# Patient Record
Sex: Female | Born: 1980
Health system: Southern US, Academic
[De-identification: ages and names within clinical notes are randomized; demographics above are authoritative.]

## PROBLEM LIST (undated history)

## (undated) ENCOUNTER — Ambulatory Visit
Payer: MEDICAID | Attending: Student in an Organized Health Care Education/Training Program | Primary: Student in an Organized Health Care Education/Training Program

## (undated) ENCOUNTER — Encounter

## (undated) ENCOUNTER — Ambulatory Visit: Payer: MEDICAID | Attending: Psychiatric/Mental Health | Primary: Psychiatric/Mental Health

## (undated) ENCOUNTER — Ambulatory Visit: Attending: Clinical | Primary: Clinical

## (undated) ENCOUNTER — Telehealth
Attending: Student in an Organized Health Care Education/Training Program | Primary: Student in an Organized Health Care Education/Training Program

## (undated) ENCOUNTER — Other Ambulatory Visit

## (undated) ENCOUNTER — Telehealth

## (undated) ENCOUNTER — Ambulatory Visit

## (undated) ENCOUNTER — Encounter
Attending: Student in an Organized Health Care Education/Training Program | Primary: Student in an Organized Health Care Education/Training Program

## (undated) ENCOUNTER — Encounter: Attending: Psychiatric/Mental Health | Primary: Psychiatric/Mental Health

## (undated) ENCOUNTER — Ambulatory Visit: Payer: Medicaid (Managed Care)

## (undated) ENCOUNTER — Ambulatory Visit: Payer: MEDICAID | Attending: Clinical | Primary: Clinical

## (undated) ENCOUNTER — Telehealth: Attending: Psychiatric/Mental Health | Primary: Psychiatric/Mental Health

## (undated) ENCOUNTER — Ambulatory Visit: Payer: MEDICAID

## (undated) ENCOUNTER — Telehealth: Attending: Clinical | Primary: Clinical

## (undated) ENCOUNTER — Ambulatory Visit: Payer: MEDICAID | Attending: Internal Medicine | Primary: Internal Medicine

## (undated) ENCOUNTER — Encounter: Attending: Psychosomatic Medicine | Primary: Psychosomatic Medicine

## (undated) ENCOUNTER — Encounter: Attending: Clinical | Primary: Clinical

## (undated) DIAGNOSIS — F329 Major depressive disorder, single episode, unspecified: Secondary | ICD-10-CM

## (undated) HISTORY — PX: PANCREAS SURGERY: SHX731

## (undated) MED ORDER — HYDROXYZINE HCL 10 MG TABLET: Freq: Three times a day (TID) | ORAL | 0 days | PRN

---
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
## 2004-04-11 ENCOUNTER — Emergency Department: Payer: Self-pay | Admitting: Emergency Medicine

## 2004-05-08 ENCOUNTER — Ambulatory Visit: Payer: Self-pay | Admitting: Physician Assistant

## 2005-07-30 ENCOUNTER — Emergency Department: Payer: Self-pay | Admitting: Emergency Medicine

## 2005-08-04 ENCOUNTER — Emergency Department: Payer: Self-pay | Admitting: General Practice

## 2005-09-08 ENCOUNTER — Emergency Department: Payer: Self-pay | Admitting: Emergency Medicine

## 2005-09-14 ENCOUNTER — Emergency Department: Payer: Self-pay | Admitting: Unknown Physician Specialty

## 2005-10-01 ENCOUNTER — Emergency Department: Payer: Self-pay | Admitting: Emergency Medicine

## 2006-03-10 ENCOUNTER — Emergency Department: Payer: Self-pay | Admitting: Emergency Medicine

## 2006-12-07 ENCOUNTER — Inpatient Hospital Stay: Payer: Self-pay | Admitting: Internal Medicine

## 2007-03-30 IMAGING — CT CT ABD-PELV W/O CM
1 of 2 series · 15 of 32 positions shown, 19 images · non-contrast
Comparison: none

REASON FOR EXAM: Abdominal pain
COMMENTS:

PROCEDURE:     CT  - CT ABDOMEN AND PELVIS W[DATE]  [DATE]
RESULT:     Noncontrast emergent CT scan of abdomen and pelvis is performed.
There are no prior studies available for comparison.

[Series 2: stone · axial · 0.69mm/px · z∈[-978,-609]mm · 15 of 138 slices shown, 19 images]
[im 10/138  soft-tissue]
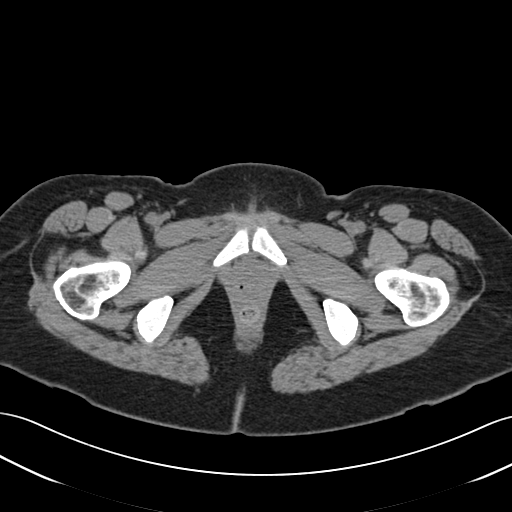
[im 10/138  bone]
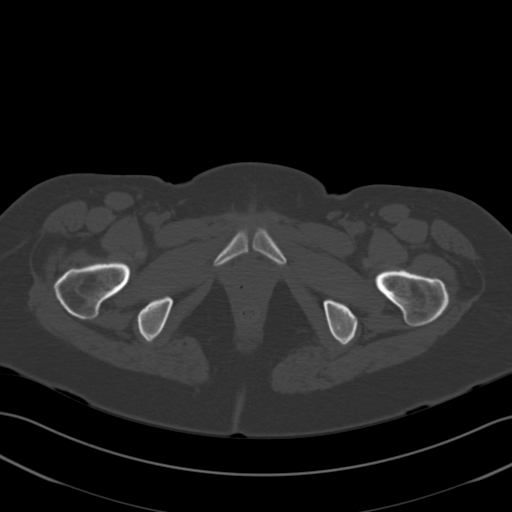
[im 20/138  soft-tissue]
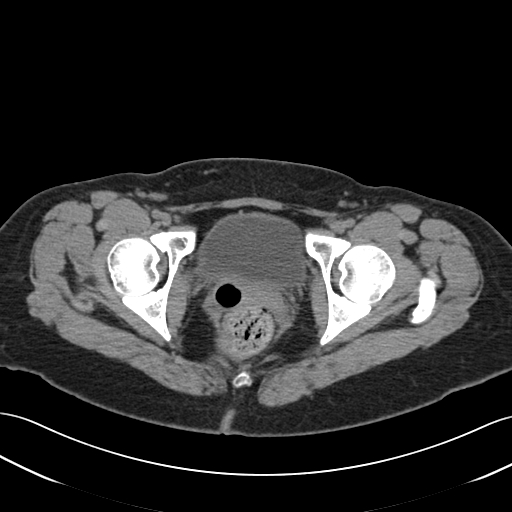
[im 30/138  soft-tissue]
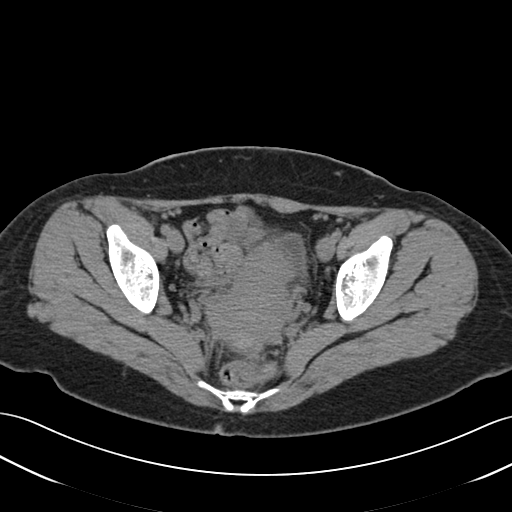
[im 40/138  soft-tissue]
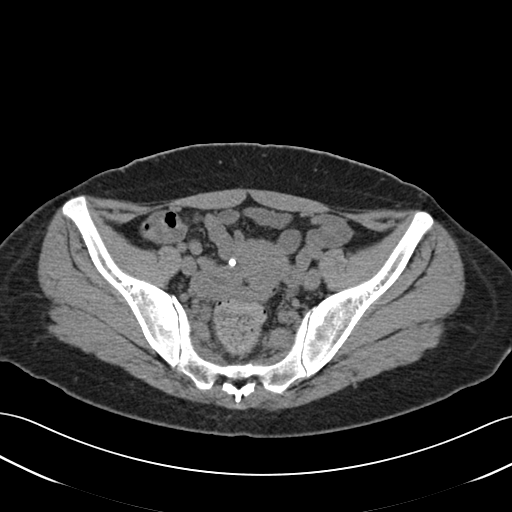
[im 49/138  soft-tissue]
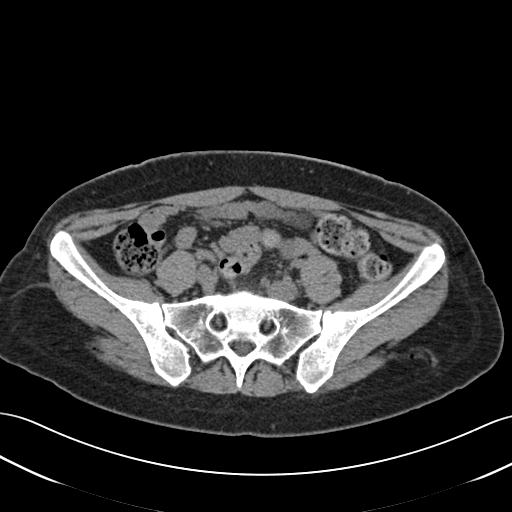
[im 59/138  soft-tissue]
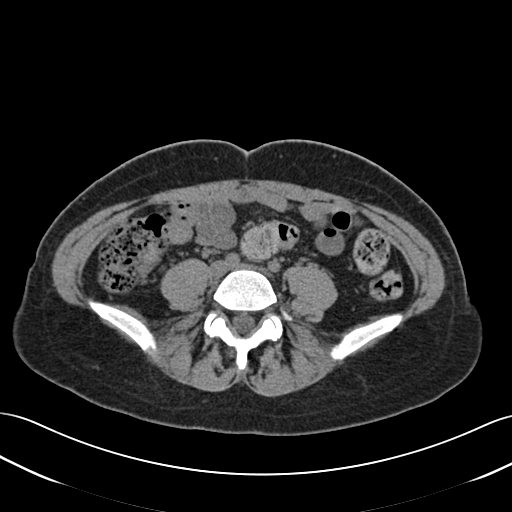
[im 69/138  soft-tissue]
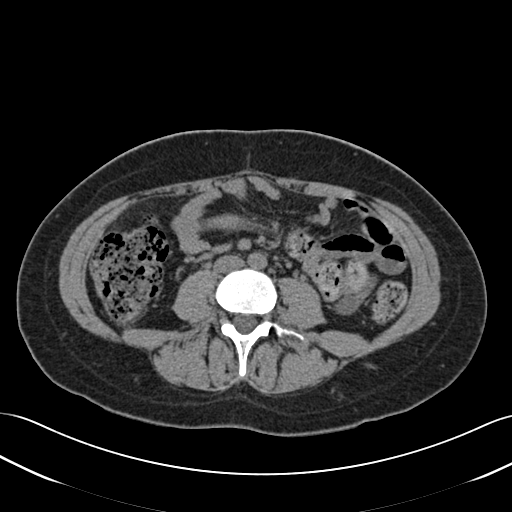
[im 79/138  soft-tissue]
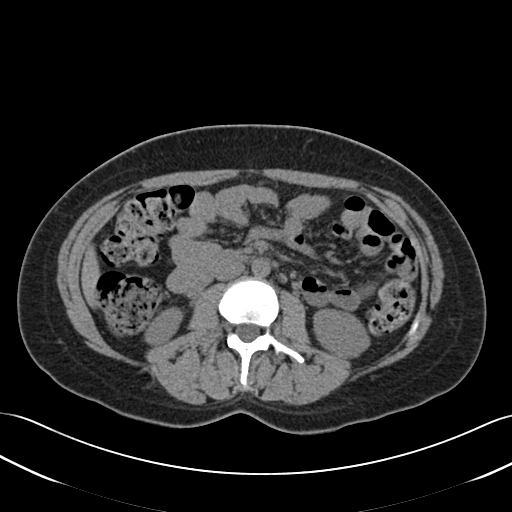
[im 89/138  soft-tissue]
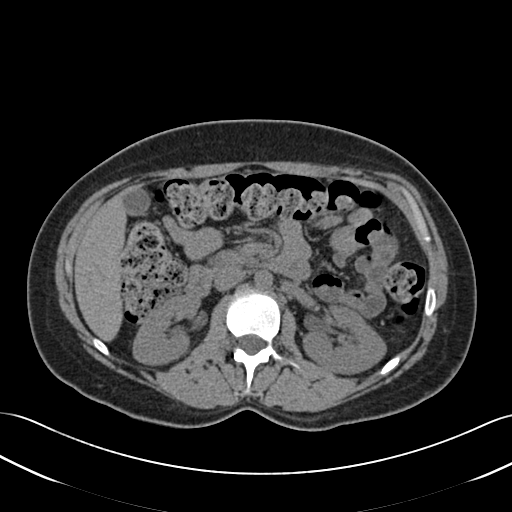
[im 89/138  bone]
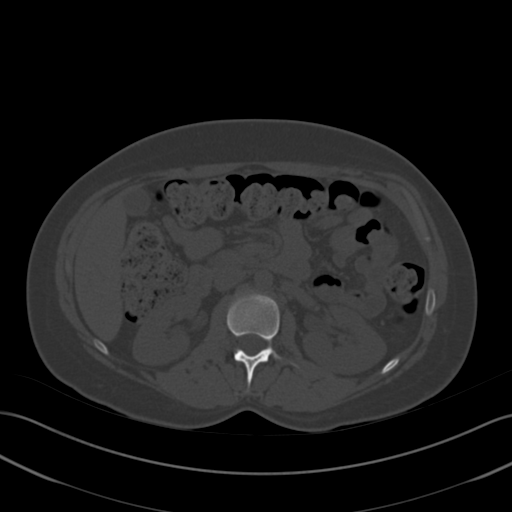
[im 98/138  soft-tissue]
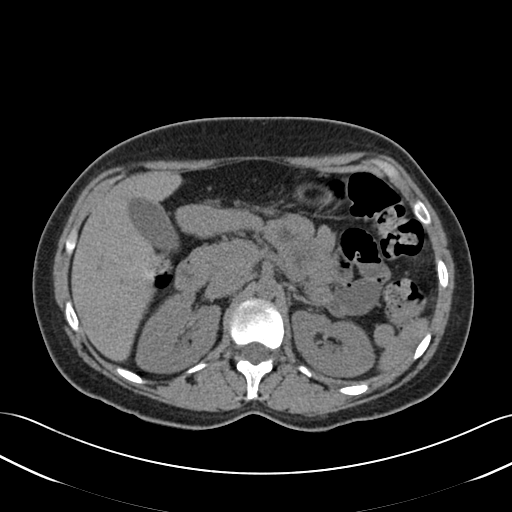
[im 108/138  soft-tissue]
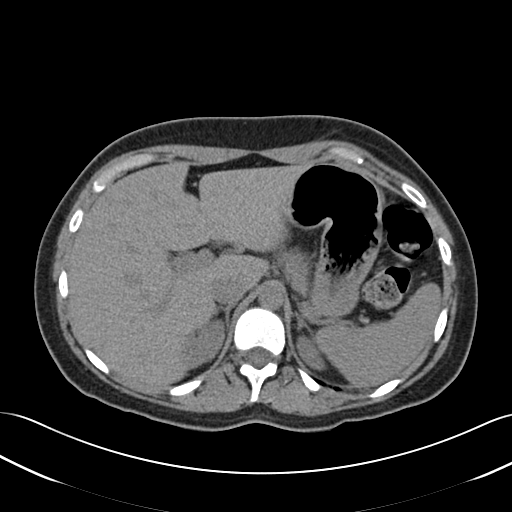
[im 118/138  soft-tissue]
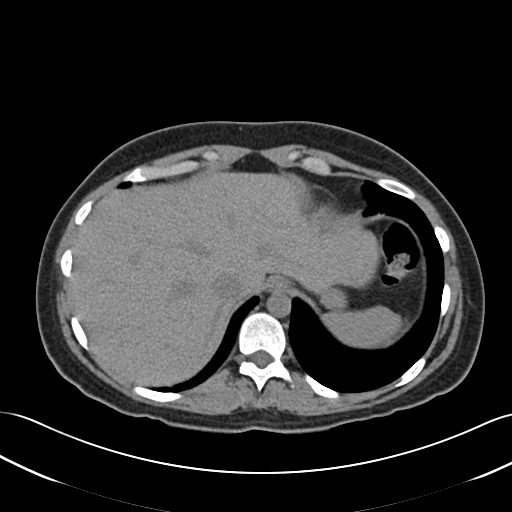
[im 118/138  lung]
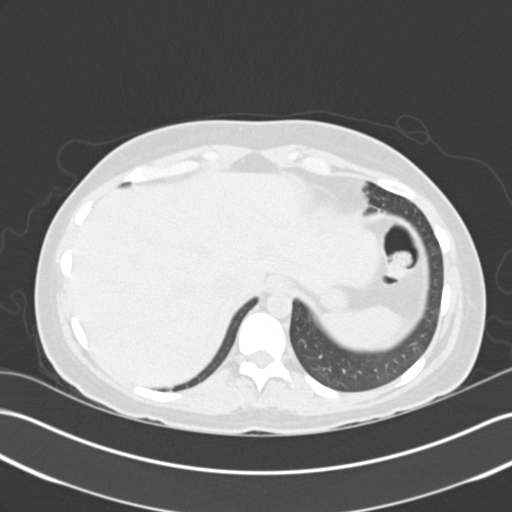
[im 123/138  lung]
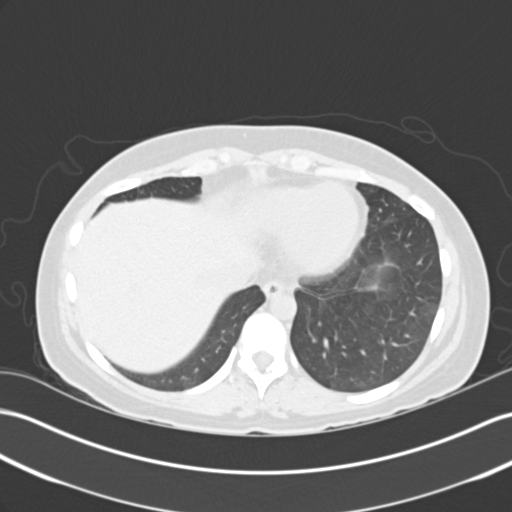
[im 128/138  soft-tissue]
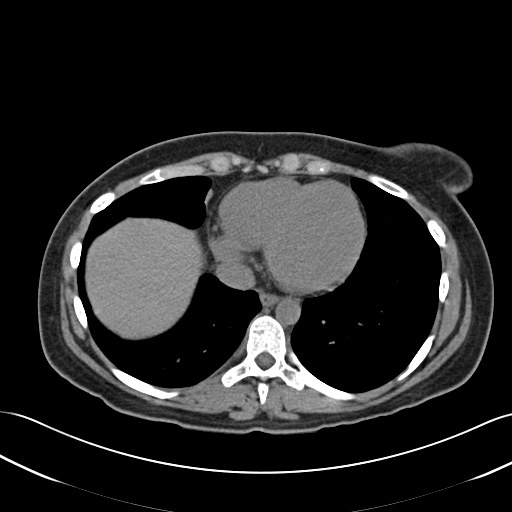
[im 128/138  lung]
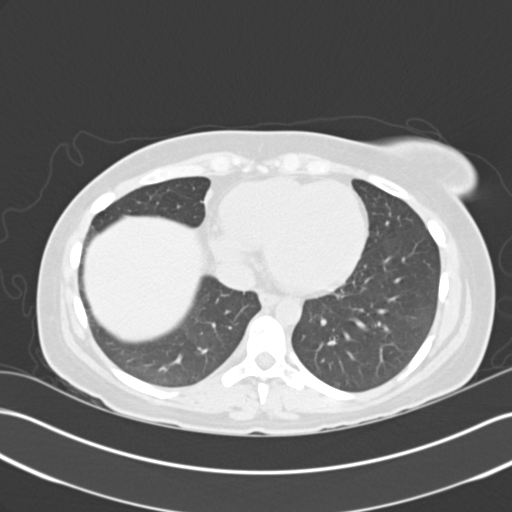
[im 133/138  lung]
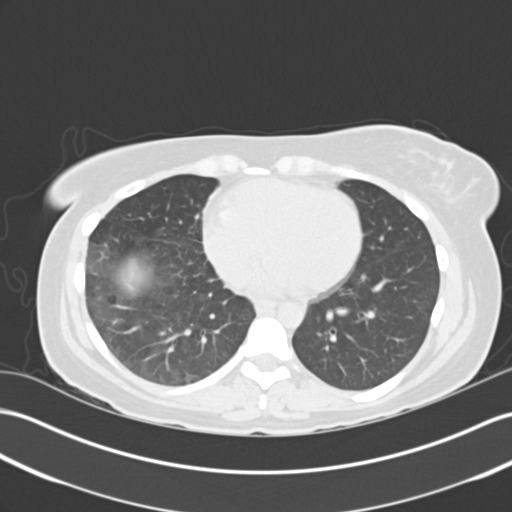

[15 of 32 positions shown; findings below may reference images not displayed]

FINDINGS: The lung bases are clear. The adrenal glands appear to be normal.
No definite radiopaque urinary tract stone or obstructive change is evident.
The appendix appears to be normal. There is no evidence of diverticulitis.
There appears to be a vaginal tampon present. No significant free pelvic
fluid is seen. The uterus is present. The patient appears to be status post
tubal ligation. The abdominal aorta is normal in caliber. No radiopaque
gallstones are evident. Again, the kidneys show no obstructive change or
evidence of stones. There is no perirenal, retroperitoneal or peritoneal
inflammatory stranding.
IMPRESSION: No findings to suggest hydronephrosis or hydroureter. No
stones are evident.

## 2007-07-02 ENCOUNTER — Emergency Department: Payer: Self-pay | Admitting: Emergency Medicine

## 2007-07-05 ENCOUNTER — Emergency Department: Payer: Self-pay | Admitting: Emergency Medicine

## 2007-08-30 ENCOUNTER — Emergency Department: Payer: Self-pay | Admitting: Emergency Medicine

## 2008-05-30 ENCOUNTER — Emergency Department: Payer: Self-pay | Admitting: Emergency Medicine

## 2008-08-01 ENCOUNTER — Emergency Department: Payer: Self-pay | Admitting: Unknown Physician Specialty

## 2009-04-23 ENCOUNTER — Emergency Department: Payer: Self-pay | Admitting: Emergency Medicine

## 2009-05-15 ENCOUNTER — Emergency Department: Payer: Self-pay | Admitting: Emergency Medicine

## 2009-07-29 ENCOUNTER — Emergency Department: Payer: Self-pay | Admitting: Internal Medicine

## 2010-06-26 ENCOUNTER — Emergency Department: Payer: Self-pay | Admitting: Emergency Medicine

## 2011-01-18 ENCOUNTER — Emergency Department: Payer: Self-pay | Admitting: Internal Medicine

## 2011-01-20 ENCOUNTER — Ambulatory Visit: Payer: Self-pay | Admitting: Orthopedic Surgery

## 2014-10-27 ENCOUNTER — Emergency Department
Admission: EM | Admit: 2014-10-27 | Discharge: 2014-10-27 | Disposition: A | Discharge status: Home / Self Care | Payer: Self-pay

## 2016-10-07 MED FILL — PAROXETINE HCL/10MG/TAB: PAROXETINE HCL/10MG/TAB | 30 days supply | Qty: 30 | Fill #0

## 2016-11-10 ENCOUNTER — Emergency Department
Admission: EM | Admit: 2016-11-10 | Discharge: 2016-11-11 | Disposition: A | Discharge status: Home / Self Care | Payer: Medicaid Other | Attending: Emergency Medicine | Admitting: Emergency Medicine

## 2016-11-10 ENCOUNTER — Encounter: Payer: Self-pay | Admitting: Emergency Medicine

## 2016-11-10 DIAGNOSIS — R443 Hallucinations, unspecified: Secondary | ICD-10-CM

## 2016-11-10 DIAGNOSIS — R440 Auditory hallucinations: Secondary | ICD-10-CM | POA: Diagnosis not present

## 2016-11-10 DIAGNOSIS — F309 Manic episode, unspecified: Secondary | ICD-10-CM | POA: Diagnosis present

## 2016-11-10 DIAGNOSIS — F1910 Other psychoactive substance abuse, uncomplicated: Secondary | ICD-10-CM

## 2016-11-10 DIAGNOSIS — F1510 Other stimulant abuse, uncomplicated: Secondary | ICD-10-CM | POA: Insufficient documentation

## 2016-11-10 DIAGNOSIS — R441 Visual hallucinations: Secondary | ICD-10-CM | POA: Diagnosis not present

## 2016-11-10 LAB — CBC
HCT: 40.1 % (ref 35.0–47.0)
HEMOGLOBIN: 13.5 g/dL (ref 12.0–16.0)
MCH: 32.5 pg (ref 26.0–34.0)
MCHC: 33.7 g/dL (ref 32.0–36.0)
MCV: 96.4 fL (ref 80.0–100.0)
PLATELETS: 347 10*3/uL (ref 150–440)
RBC: 4.16 MIL/uL (ref 3.80–5.20)
RDW: 14.5 % (ref 11.5–14.5)
WBC: 12 10*3/uL — AB (ref 3.6–11.0)

## 2016-11-10 LAB — URINALYSIS, COMPLETE (UACMP) WITH MICROSCOPIC
Bilirubin Urine: NEGATIVE
Glucose, UA: NEGATIVE mg/dL
Hgb urine dipstick: NEGATIVE
KETONES UR: 5 mg/dL — AB
LEUKOCYTES UA: NEGATIVE
NITRITE: NEGATIVE
PROTEIN: NEGATIVE mg/dL
SPECIFIC GRAVITY, URINE: 1.023 (ref 1.005–1.030)
pH: 5 (ref 5.0–8.0)

## 2016-11-10 LAB — POCT PREGNANCY, URINE: PREG TEST UR: NEGATIVE

## 2016-11-10 LAB — COMPREHENSIVE METABOLIC PANEL
ALT: 16 U/L (ref 14–54)
ANION GAP: 9 (ref 5–15)
AST: 23 U/L (ref 15–41)
Albumin: 4.2 g/dL (ref 3.5–5.0)
Alkaline Phosphatase: 48 U/L (ref 38–126)
BUN: 9 mg/dL (ref 6–20)
CHLORIDE: 109 mmol/L (ref 101–111)
CO2: 24 mmol/L (ref 22–32)
Calcium: 9.5 mg/dL (ref 8.9–10.3)
Creatinine, Ser: 0.63 mg/dL (ref 0.44–1.00)
GFR calc Af Amer: >60 mL/min (ref >60)
GFR calc non Af Amer: >60 mL/min (ref >60)
GLUCOSE: 112 mg/dL — AB (ref 65–99)
POTASSIUM: 4 mmol/L (ref 3.5–5.1)
Sodium: 142 mmol/L (ref 135–145)
TOTAL PROTEIN: 7.2 g/dL (ref 6.5–8.1)
Total Bilirubin: 0.6 mg/dL (ref 0.3–1.2)

## 2016-11-10 LAB — URINE DRUG SCREEN, QUALITATIVE (ARMC ONLY)
AMPHETAMINES, UR SCREEN: POSITIVE — AB
BENZODIAZEPINE, UR SCRN: NOT DETECTED
Barbiturates, Ur Screen: NOT DETECTED
Cannabinoid 50 Ng, Ur ~~LOC~~: NOT DETECTED
Cocaine Metabolite,Ur ~~LOC~~: NOT DETECTED
MDMA (ECSTASY) UR SCREEN: NOT DETECTED
METHADONE SCREEN, URINE: NOT DETECTED
OPIATE, UR SCREEN: NOT DETECTED
PHENCYCLIDINE (PCP) UR S: NOT DETECTED
Tricyclic, Ur Screen: NOT DETECTED

## 2016-11-10 LAB — ETHANOL: Alcohol, Ethyl (B): <5 mg/dL (ref <5)

## 2016-11-10 MED ORDER — FOSFOMYCIN TROMETHAMINE 3 G PO PACK
3.0000 g | PACK | Freq: Once | ORAL | Status: AC
  Administered 2016-11-10: 3 g via ORAL
  Filled 2016-11-10 (×2): qty 3

## 2016-11-10 NOTE — ED Notes (Signed)
Pt. To BHU from ED ambulatory without difficulty, to room  BHU 2. Report from [facility]. Pt. Is alert and oriented, warm and dry in no distress. Pt. Denies SI, HI, and AVH. Pt. Calm and cooperative. Pt made aware of positive UDS for amphetamine, patient states she had took some adderall the other day and she is not prescribed the medication. Pt states she was just having a bad day and took them. Juice and sandwich tray given to patient. Pt. Made aware of security cameras and Q15 minute rounds. Pt. Encouraged to let Nursing staff know of any concerns or needs.

## 2016-11-10 NOTE — ED Notes (Signed)
Belongings catalogs and placed in locker [place].  Bag consist of 1-yellow tank top, 1- pair of light washed jeans, 1- pair of pink socks, 1- black panties, 1-nude color bra, 1- pain of black lace up tennis shoes, 1- clear hair clip.

## 2016-11-10 NOTE — ED Provider Notes (Signed)
[facility] Emergency Department Provider Note  ____________________________________________  Time seen: Approximately 9:39 PM  I have reviewed the triage vital signs and the nursing notes.   HISTORY  Chief Complaint Manic Behavior   HPI Tamara Nguyen is a 36 y.o. female with a history of pancreatitis who presents IVC by her mother for bizarre behavior. According to IVC paperwork patient has been talking to people that are not in the house. She has been having visual and auditory hallucinations. Patient has a 75 year old daughter who lives with her and is afraid the patient will mistake daughter by somebody else and hurt her. Patient denies hallucinations. She denies any prior psychiatric history. She denies drugs or alcohol. She does report being under a lot of stress since separating from her husband a week ago. She denies any suicidal or homicidal ideation. She denies any prior psychiatric admissions.She denies any medical complaints.  No past medical history on file.  There are no active problems to display for this patient.   No past surgical history on file.  Prior to Admission medications   Medication Sig Start Date End Date Taking? Authorizing Provider  lipase/protease/amylase (CREON) 12000 units CPEP capsule Take 12,000 Units by mouth as directed.   Yes [provider]  prochlorperazine (COMPAZINE) 10 MG tablet Take 10 mg by mouth daily as needed for nausea or vomiting.   Yes [provider]    Allergies Patient has no known allergies.  No family history on file.  Social History Social History  Substance Use Topics  . Smoking status: Not on file  . Smokeless tobacco: Not on file  . Alcohol use Not on file    Review of Systems  Constitutional: Negative for fever. Eyes: Negative for visual changes. ENT: Negative for sore throat. Neck: No neck pain  Cardiovascular: Negative for chest pain. Respiratory: Negative  for shortness of breath. Gastrointestinal: Negative for abdominal pain, vomiting or diarrhea. Genitourinary: Negative for dysuria. Musculoskeletal: Negative for back pain. Skin: Negative for rash. Neurological: Negative for headaches, weakness or numbness. Psych: No SI or HI  ____________________________________________   PHYSICAL EXAM:  VITAL SIGNS: ED Triage Vitals [11/10/16 2040]  Enc Vitals Group     BP 118/65     Pulse Rate 65     Resp 20     Temp 98.1 F (36.7 C)     Temp Source Oral     SpO2 100 %     Weight 120 lb (54.4 kg)     Height 5\' 1"  (1.549 m)     Head Circumference      Peak Flow      Pain Score      Pain Loc      Pain Edu?      Excl. in GC?     Constitutional: Alert and oriented. Well appearing and in no apparent distress. HEENT:      Head: Normocephalic and atraumatic.         Eyes: Conjunctivae are normal. Sclera is non-icteric.       Mouth/Throat: Mucous membranes are moist.       Neck: Supple with no signs of meningismus. Cardiovascular: Regular rate and rhythm. No murmurs, gallops, or rubs. 2+ symmetrical distal pulses are present in all extremities. No JVD. Respiratory: Normal respiratory effort. Lungs are clear to auscultation bilaterally. No wheezes, crackles, or rhonchi.  Gastrointestinal: Soft, non tender, and non distended with positive bowel sounds. No rebound or guarding. Genitourinary: No CVA tenderness. Musculoskeletal:  Nontender with normal range of motion in all extremities. No edema, cyanosis, or erythema of extremities. Neurologic: Normal speech and language. Face is symmetric. Moving all extremities. No gross focal neurologic deficits are appreciated. Skin: Skin is warm, dry and intact. No rash noted. Psychiatric: Mood and affect are normal. Speech and behavior are normal.  ____________________________________________   LABS (all labs ordered are listed, but only abnormal results are displayed)  Labs Reviewed  CBC - Abnormal;  Notable for the following:       Result Value   WBC 12.0 (*)    All other components within normal limits  COMPREHENSIVE METABOLIC PANEL - Abnormal; Notable for the following:    Glucose, Bld 112 (*)    All other components within normal limits  URINALYSIS, COMPLETE (UACMP) WITH MICROSCOPIC - Abnormal; Notable for the following:    Color, Urine AMBER (*)    APPearance CLEAR (*)    Ketones, ur 5 (*)    Bacteria, UA RARE (*)    Squamous Epithelial / LPF 0-5 (*)    All other components within normal limits  URINE DRUG SCREEN, QUALITATIVE ([facility] ONLY) - Abnormal; Notable for the following:    Amphetamines, Ur Screen POSITIVE (*)    All other components within normal limits  ETHANOL  POC URINE PREG, ED  POCT PREGNANCY, URINE   ____________________________________________  EKG  none  ____________________________________________  RADIOLOGY  none  ____________________________________________   PROCEDURES  Procedure(s) performed: None Procedures Critical Care performed:  None ____________________________________________   INITIAL IMPRESSION / ASSESSMENT AND PLAN / ED COURSE  36 y.o. female with a history of pancreatitis who presents IVC by her mother for bizarre behavior. Patient denies hallucinations, SI or HI. Patient is cooperative, neurologically intact, normal vital signs.blood work for no acute findings. Drug screen positive for amphetamines. Will keep IVC and consult psych for further evaluation.     Pertinent labs & imaging results that were available during my care of the patient were reviewed by me and considered in my medical decision making (see chart for details).    ____________________________________________   FINAL CLINICAL IMPRESSION(S) / ED DIAGNOSES  Final diagnoses:  Hallucinations      NEW MEDICATIONS STARTED DURING THIS VISIT:  New Prescriptions   No medications on file     Note:  This document was prepared using Dragon voice  recognition software and may include unintentional dictation errors.    Nita Sickle, MD 11/10/16 [phone]

## 2016-11-10 NOTE — BH Assessment (Signed)
Assessment Note  Tamara Nguyen is an 36 y.o. female. The patient came in under IVC after her mother reported she was having hallucinations.  The patient is denying having hallucinations and reported she has never had hallucinations.  She describes her primary stressors as her recent separation from her husband.  She has been separated for a week.  She is also not getting along with her mother.  She reported some days the patient will sleep more than she needs to and for the past few days the patient has not been sleeping many hours at night.  She also has a decreased appetite and concentration.   She is currently not seeing a counselor or psychiatrist.  However, she is taking medication for depression, which is prescribed by her PCP.  She is now taking Paxil.  She has a history of alcohol abuse and has been sober for 2 years.  She denies present SI, HI, SA and psychosis.  Diagnosis: Major Depressive Disorder, Severe  Past Medical History: No past medical history on file.  No past surgical history on file.  Family History: No family history on file.  Social History:  has no tobacco, alcohol, and drug history on file.  Additional Social History:  Alcohol / Drug Use Pain Medications: See PTA Prescriptions: See PTA Over the Counter: See PTA History of alcohol / drug use?: Yes Longest period of sobriety (when/how long): 2 years Substance #1 Name of Substance 1: alcohol 1 - Amount (size/oz): 3 24 oz beers 1 - Duration: daily 1 - Last Use / Amount: October 2016  CIWA: CIWA-Ar BP: 118/65 Pulse Rate: 65 COWS:    Allergies: No Known Allergies  Home Medications:  (Not in a hospital admission)  OB/GYN Status:  Patient's last menstrual period was 10/10/2016.  General Assessment Data Location of Assessment: [facility] ED TTS Assessment: In system Is this a Tele or Face-to-Face Assessment?: Face-to-Face Is this an Initial Assessment or a Re-assessment for this encounter?: Initial  Assessment Marital status: Separated Is patient pregnant?: No Pregnancy Status: No Living Arrangements: Children Can pt return to current living arrangement?: Yes Admission Status: Involuntary Is patient capable of signing voluntary admission?: No Referral Source: Self/Family/Friend Insurance type: Medicaid     Crisis Care Plan Living Arrangements: Children Legal Guardian: Other: (Self) Name of Psychiatrist: none Name of Therapist: none  Education Status Is patient currently in school?: No Current Grade: NA Highest grade of school patient has completed: 12 th grade Name of school: NA Contact person: NA  Risk to self with the past 6 months Suicidal Ideation: No Has patient been a risk to self within the past 6 months prior to admission? : No Suicidal Intent: No Has patient had any suicidal intent within the past 6 months prior to admission? : No Is patient at risk for suicide?: No Suicidal Plan?: No Has patient had any suicidal plan within the past 6 months prior to admission? : No Access to Means: No What has been your use of drugs/alcohol within the last 12 months?: History of alcohol abuse has been sober for 2 years. Previous Attempts/Gestures: No How many times?: 0 Other Self Harm Risks: none Triggers for Past Attempts: None known Intentional Self Injurious Behavior: None Family Suicide History: Unknown Recent stressful life event(s): Other (Comment), Conflict (Comment) (conflict with mother and recent seperation) Persecutory voices/beliefs?: No Depression: Yes Depression Symptoms: Insomnia, Isolating, Loss of interest in usual pleasures Substance abuse history and/or treatment for substance abuse?: Yes Suicide prevention information given to  non-admitted patients: Not applicable  Risk to Others within the past 6 months Homicidal Ideation: No Does patient have any lifetime risk of violence toward others beyond the six months prior to admission? : No Thoughts of  Harm to Others: No Current Homicidal Intent: No Current Homicidal Plan: No Access to Homicidal Means: No Identified Victim: none History of harm to others?: No Assessment of Violence: None Noted Violent Behavior Description: none Does patient have access to weapons?: No Criminal Charges Pending?: No Does patient have a court date: No Is patient on probation?: No  Psychosis Hallucinations: None noted Delusions: None noted  Mental Status Report Appearance/Hygiene: Unremarkable, In scrubs Eye Contact: Good Motor Activity: Unremarkable, Freedom of movement Speech: Argumentative, Unremarkable Level of Consciousness: Alert Mood: Pleasant Affect: Appropriate to circumstance Anxiety Level: None Thought Processes: Coherent, Relevant Judgement: Unimpaired Orientation: Person, Place, Time, Situation, Appropriate for developmental age Obsessive Compulsive Thoughts/Behaviors: None  Cognitive Functioning Concentration: Decreased Memory: Recent Intact, Remote Intact IQ: Average Insight: Fair Impulse Control: Good Appetite: Fair Weight Loss: 0 Weight Gain: 0 Sleep: Decreased Total Hours of Sleep: 5 Vegetative Symptoms: None  ADLScreening [facility] Assessment Services) Patient's cognitive ability adequate to safely complete daily activities?: Yes Patient able to express need for assistance with ADLs?: No Independently performs ADLs?: Yes (appropriate for developmental age)  Prior Inpatient Therapy Prior Inpatient Therapy: No Prior Therapy Dates: NA Prior Therapy Facilty/Provider(s): NA Reason for Treatment: NA  Prior Outpatient Therapy Prior Outpatient Therapy: No Prior Therapy Dates: NA Prior Therapy Facilty/Provider(s): NA Reason for Treatment: NA Does patient have an ACCT team?: No Does patient have Intensive In-House Services?  : No Does patient have [facility] services? : No Does patient have P4CC services?: No  ADL Screening (condition at time of admission) Patient's  cognitive ability adequate to safely complete daily activities?: Yes Is the patient deaf or have difficulty hearing?: No Does the patient have difficulty seeing, even when wearing glasses/contacts?: No Does the patient have difficulty concentrating, remembering, or making decisions?: No Patient able to express need for assistance with ADLs?: No Does the patient have difficulty dressing or bathing?: No Independently performs ADLs?: Yes (appropriate for developmental age) Does the patient have difficulty walking or climbing stairs?: No Weakness of Legs: None Weakness of Arms/Hands: None  Home Assistive Devices/Equipment Home Assistive Devices/Equipment: None  Therapy Consults (therapy consults require a physician order) PT Evaluation Needed: No OT Evalulation Needed: No SLP Evaluation Needed: No Abuse/Neglect Assessment (Assessment to be complete while patient is alone) Physical Abuse: Denies Verbal Abuse: Denies Sexual Abuse: Denies Exploitation of patient/patient's resources: Denies Self-Neglect: Denies Values / Beliefs Cultural Requests During Hospitalization: None Spiritual Requests During Hospitalization: None Consults Spiritual Care Consult Needed: No Social Work Consult Needed: No      Additional Information 1:1 In Past 12 Months?: No CIRT Risk: No Elopement Risk: No Does patient have medical clearance?: Yes     Disposition:  Disposition Initial Assessment Completed for this Encounter: Yes Disposition of Patient: Referred to [facility])  On Site Evaluation by:   Reviewed with Physician:    Ottis Stain 11/10/2016 10:45 PM

## 2016-11-10 NOTE — ED Triage Notes (Signed)
Pt brought in IVC for hallucinations, pt denies any HI or SI is calm and cooperative at this time.

## 2016-11-10 NOTE — ED Notes (Signed)
Patient laying in bed currently aware. Pt is calm.  Respirations even and un-labored.

## 2016-11-10 NOTE — ED Notes (Signed)

## 2016-11-10 NOTE — ED Notes (Signed)
Report received from [facility]. Patient to be moved to [facility].

## 2016-11-11 NOTE — ED Notes (Signed)
Patient up to restroom.

## 2016-11-11 NOTE — ED Notes (Signed)
Pt received breakfast tray 

## 2016-11-11 NOTE — ED Notes (Signed)
Patient laying in bed with eyes closed. Respirations even and unlabored.

## 2016-11-11 NOTE — ED Notes (Signed)
Pt discharged to lobby. Pt denies SI/HI and AVH. Pt confirmed all belongings had been returned. Discharge paperwork reviewed with patient.

## 2016-11-11 NOTE — ED Notes (Signed)
Patient made aware of pending discharge. Patient states unable to find ride at this time.

## 2016-11-11 NOTE — ED Provider Notes (Signed)
-----------------------------------------   4:15 AM on 11/11/2016 -----------------------------------------  Patient was evaluated by [facility] psychiatrist Dr. Burnetta Sabin our who deems patient is psychiatrically stable for discharge home. He will resend patient's IVC. No new medication recommendations. Patient will follow-up with local mental health clinic.   Irean Hong, MD 11/11/16 [phone]

## 2016-11-11 NOTE — ED Notes (Signed)
Pt calm and cooperative. Pt denies HI/SI and AVH. Denies pain. Pt allowed to use the phone to call her father and ask about picking her up from the hospital.  Maintained on 15 minute checks and observation by security camera for safety.

## 2016-11-11 NOTE — ED Notes (Signed)
Report given to SOC. SOC in progress.  

## 2016-11-11 NOTE — ED Notes (Addendum)
Patient laying in bed with eyes closed. Respirations even and unlabored.

## 2016-11-11 NOTE — Discharge Instructions (Signed)
Please follow up with the resources provided. Return to the ER for feelings of hurting yourself or others or other concerns

## 2016-11-11 NOTE — ED Notes (Signed)
Patient laying in bed quietly. Patient does not appear asleep. Respiration even and un-labored.

## 2016-11-28 ENCOUNTER — Emergency Department
Admission: EM | Admit: 2016-11-28 | Discharge: 2016-12-01 | Disposition: A | Discharge status: Home / Self Care | Payer: Medicaid Other | Attending: Emergency Medicine | Admitting: Emergency Medicine

## 2016-11-28 DIAGNOSIS — F15950 Other stimulant use, unspecified with stimulant-induced psychotic disorder with delusions: Secondary | ICD-10-CM | POA: Diagnosis not present

## 2016-11-28 DIAGNOSIS — F29 Unspecified psychosis not due to a substance or known physiological condition: Secondary | ICD-10-CM | POA: Insufficient documentation

## 2016-11-28 DIAGNOSIS — F1510 Other stimulant abuse, uncomplicated: Secondary | ICD-10-CM

## 2016-11-28 DIAGNOSIS — Z046 Encounter for general psychiatric examination, requested by authority: Secondary | ICD-10-CM | POA: Diagnosis present

## 2016-11-28 HISTORY — DX: Major depressive disorder, single episode, unspecified: F32.9

## 2016-11-28 LAB — COMPREHENSIVE METABOLIC PANEL
ALT: 18 U/L (ref 14–54)
AST: 19 U/L (ref 15–41)
Albumin: 4.2 g/dL (ref 3.5–5.0)
Alkaline Phosphatase: 48 U/L (ref 38–126)
Anion gap: 9 (ref 5–15)
BILIRUBIN TOTAL: 0.6 mg/dL (ref 0.3–1.2)
BUN: 11 mg/dL (ref 6–20)
CHLORIDE: 104 mmol/L (ref 101–111)
CO2: 27 mmol/L (ref 22–32)
CREATININE: 0.72 mg/dL (ref 0.44–1.00)
Calcium: 9.4 mg/dL (ref 8.9–10.3)
GFR calc Af Amer: >60 mL/min (ref >60)
Glucose, Bld: 115 mg/dL — ABNORMAL HIGH (ref 65–99)
Potassium: 4.4 mmol/L (ref 3.5–5.1)
SODIUM: 140 mmol/L (ref 135–145)
Total Protein: 7 g/dL (ref 6.5–8.1)

## 2016-11-28 LAB — CBC
HEMATOCRIT: 39.4 % (ref 35.0–47.0)
HEMOGLOBIN: 13.5 g/dL (ref 12.0–16.0)
MCH: 33.8 pg (ref 26.0–34.0)
MCHC: 34.3 g/dL (ref 32.0–36.0)
MCV: 98.5 fL (ref 80.0–100.0)
Platelets: 355 10*3/uL (ref 150–440)
RBC: 4 MIL/uL (ref 3.80–5.20)
RDW: 14 % (ref 11.5–14.5)
WBC: 8.7 10*3/uL (ref 3.6–11.0)

## 2016-11-28 NOTE — ED Triage Notes (Signed)
Pt brought in under IVC after verbal argument with daughter. Pt uncooperative regarding answering questions. Pt physically cooperative.

## 2016-11-29 ENCOUNTER — Encounter: Payer: Self-pay | Admitting: Emergency Medicine

## 2016-11-29 LAB — URINE DRUG SCREEN, QUALITATIVE (ARMC ONLY)
Amphetamines, Ur Screen: POSITIVE — AB
BARBITURATES, UR SCREEN: NOT DETECTED
BENZODIAZEPINE, UR SCRN: POSITIVE — AB
COCAINE METABOLITE, UR ~~LOC~~: NOT DETECTED
Cannabinoid 50 Ng, Ur ~~LOC~~: NOT DETECTED
MDMA (Ecstasy)Ur Screen: NOT DETECTED
METHADONE SCREEN, URINE: NOT DETECTED
OPIATE, UR SCREEN: NOT DETECTED
PHENCYCLIDINE (PCP) UR S: NOT DETECTED
Tricyclic, Ur Screen: POSITIVE — AB

## 2016-11-29 LAB — ETHANOL

## 2016-11-29 LAB — ACETAMINOPHEN LEVEL: Acetaminophen (Tylenol), Serum: <10 ug/mL — ABNORMAL LOW (ref 10–30)

## 2016-11-29 LAB — PREGNANCY, URINE: PREG TEST UR: NEGATIVE

## 2016-11-29 LAB — SALICYLATE LEVEL: Salicylate Lvl: <7 mg/dL (ref 2.8–30.0)

## 2016-11-29 MED ORDER — LORAZEPAM 2 MG/ML IJ SOLN
2.0000 mg | Freq: Once | INTRAMUSCULAR | Status: AC
  Administered 2016-11-29: 2 mg via INTRAVENOUS

## 2016-11-29 MED ORDER — ZIPRASIDONE MESYLATE 20 MG IM SOLR
10.0000 mg | Freq: Once | INTRAMUSCULAR | Status: AC
  Administered 2016-11-29: 10 mg via INTRAMUSCULAR

## 2016-11-29 MED ORDER — ZIPRASIDONE MESYLATE 20 MG IM SOLR
INTRAMUSCULAR | Status: AC
  Administered 2016-11-29: 10 mg via INTRAMUSCULAR
  Filled 2016-11-29: qty 20

## 2016-11-29 MED ORDER — LORAZEPAM 2 MG/ML IJ SOLN
INTRAMUSCULAR | Status: AC
  Administered 2016-11-29: 2 mg via INTRAVENOUS
  Filled 2016-11-29: qty 1

## 2016-11-29 MED ORDER — DIPHENHYDRAMINE HCL 50 MG/ML IJ SOLN
INTRAMUSCULAR | Status: AC
  Administered 2016-11-29: 50 mg via INTRAVENOUS
  Filled 2016-11-29: qty 1

## 2016-11-29 MED ORDER — DIPHENHYDRAMINE HCL 50 MG/ML IJ SOLN
50.0000 mg | Freq: Once | INTRAMUSCULAR | Status: AC
  Administered 2016-11-29: 50 mg via INTRAVENOUS

## 2016-11-29 NOTE — ED Notes (Signed)
ED  Is the patient under IVC or is there intent for IVC: Yes.   Is the patient medically cleared: Yes.   Is there vacancy in the ED BHU: Yes.   Is the population mix appropriate for patient: Yes.   Is the patient awaiting placement in inpatient or outpatient setting: Yes.   Has the patient had a psychiatric consult:   Soc consult pending Survey of unit performed for contraband, proper placement and condition of furniture, tampering with fixtures in bathroom, shower, and each patient room: Yes.  ; Findings:  APPEARANCE/BEHAVIOR Calm and cooperative NEURO ASSESSMENT Orientation: oriented x3  Denies pain Hallucinations: No.None noted (Hallucinations) Speech: Normal Gait: normal RESPIRATORY ASSESSMENT Even  Unlabored respirations  CARDIOVASCULAR ASSESSMENT Pulses equal   regular rate  Skin warm and dry   GASTROINTESTINAL ASSESSMENT no GI complaint EXTREMITIES Full ROM  PLAN OF CARE Provide calm/safe environment. Vital signs assessed twice daily. ED BHU Assessment once each 12-hour shift. Collaborate with TTS daily or as condition indicates. Assure the ED provider has rounded once each shift. Provide and encourage hygiene. Provide redirection as needed. Assess for escalating behavior; address immediately and inform ED provider.  Assess family dynamic and appropriateness for visitation as needed: Yes.  ; If necessary, describe findings:  Educate the patient/family about BHU procedures/visitation: Yes.  ; If necessary, describe findings:

## 2016-11-29 NOTE — ED Notes (Signed)
BEHAVIORAL HEALTH ROUNDING Patient sleeping: No. Patient alert and oriented: yes Behavior appropriate: Yes.  ; If no, describe:  Nutrition and fluids offered: yes Toileting and hygiene offered: Yes  Sitter present: q15 minute observations and security  monitoring Law enforcement present: Yes  ODS  

## 2016-11-29 NOTE — ED Notes (Signed)
This RN was getting report from nightshift RN when banging was coming from patient's room. Pt banging bed against wall, threw cup of water. Pt swearing and upset saying she wants to find her daughter. Tried to redirect and calm patient down. MD notified. Med orders obtained.

## 2016-11-29 NOTE — ED Notes (Addendum)
BEHAVIORAL HEALTH ROUNDING Patient sleeping: No. Patient alert and oriented: yes Behavior appropriate: Yes.  ; If no, describe:  Nutrition and fluids offered: yes Toileting and hygiene offered: Yes  Sitter present: q15 minute observations and security monitoring Law enforcement present: Yes  ODS   She refuses to allow VS to be taken at this time

## 2016-11-29 NOTE — ED Notes (Addendum)
Pt uncooperative at this time. Yelling, cursing, being aggressive with staff. Pt started running towards officer, aggressively slammed into him. Officer took pt to bed, this RN called for help. Gearldine Bienenstock, charge rn, Dorinda Hill, RN, [place], [place] at bedside for assistance. MD notified and came to bedside. Orders obtained.

## 2016-11-29 NOTE — ED Notes (Signed)
She has changed clothes and she is standing in the hallway verbally abusing the officer - judging if he is doing his job  Pt told to return to her room - she did not like being told what to do but eventually she returned to her room and slammed the door  Continue to monitor

## 2016-11-29 NOTE — ED Notes (Signed)
She is sitting in the corner  - she has taken off her pants and ripped her shirt - when asked if she was okay or if I could do something for her she states  "i only need Tamara Nguyen and Tamara Nguyen - they are my boyfriend and my job - they are the only person I am talking to."  I placed a change of clothing onto the bed

## 2016-11-29 NOTE — ED Notes (Signed)
This RN and [place], NT dressed pt, pt half awake, refused to dressed self but allowed this staff to put underwear and burgundy scrubs on pt

## 2016-11-29 NOTE — ED Notes (Signed)
Patient observed lying in bed with eyes closed  Even, unlabored respirations observed   NAD pt appears to be sleeping  I will continue to monitor along with every 15 minute visual observations and ongoing security monitoring    

## 2016-11-29 NOTE — ED Notes (Addendum)
Pt more cooperative at this time and allowed for meds to be administered.

## 2016-11-29 NOTE — ED Provider Notes (Signed)
-----------------------------------------   8:35 AM on 11/29/2016 -----------------------------------------   Blood pressure 99/71, pulse 71, temperature 98.8 F (37.1 C), temperature source Oral, resp. rate 16, height  (1.549 m), weight 54.4 kg (120 lb), SpO2 100 %.  Patient was given compared to undergo evaluation by the specialist on-call psychiatrist when she became agitated and charged the security officer and the quad section of the emergency department. She was given 10 mg of IM Geodon in addition to 50 mg IM Benadryl and 2 mg IM Ativan. She required restraint after charging a Emergency planning/management officer andwas being uncooperative despite staff requests. For these reasons she is requiring chemical restraint.   Myrna Blazer, MD 11/29/16 [phone]

## 2016-11-29 NOTE — ED Notes (Signed)
Pt dressed out by Judeth Cornfield, NT and 2 others, now pt removing clothes, pt refuses to acknowledge requests, movements appear purposeful and pt appears to be escalating, Dr York Cerise notified

## 2016-11-29 NOTE — ED Notes (Signed)
Went in to give patient medicine, pt agreed to cooperate and talk to doctor if not given shot.

## 2016-11-29 NOTE — ED Notes (Signed)
She has ambulated into the hallway - "i am looking for [employer] - I know he is here somewhere."  Pt reassured that we do not know him  She went back to her room  Continue to monitor
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
## 2016-11-29 NOTE — ED Notes (Signed)
Pt walked out of room past ODS to address this RN: "where's my daughter and what the fuck I'm I waiting on?"  Pt threatening to leave, yelling at staff, asking to be committed (pt has been told repeatedly by this RN pt arrived [facility]), pt appears fixated on external controls: daughter's location, type of doctor that'll conduct SOC, this RN not "doing your damn job!"  Pt returned to room at this RN's request to speak privately, pt nods acknowledgment of plan to see [facility] and remain calm

## 2016-11-29 NOTE — ED Notes (Signed)
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
## 2016-11-29 NOTE — ED Notes (Signed)
Camelia Phenes, [phone], introduced self as husband to pt, unable to provide amplification as to events with pt the night prior,  reports that pt is not sleeping for days at a time, and is "probably on drugs"  Pt angry at mention of caller

## 2016-11-29 NOTE — ED Notes (Signed)
ivc 

## 2016-11-29 NOTE — ED Notes (Signed)
SOC called. Told SOC, will call when patient is awake and able to talk.

## 2016-11-29 NOTE — ED Notes (Signed)

## 2016-11-29 NOTE — ED Notes (Signed)
Pt sleeping soundly.

## 2016-11-30 MED ORDER — PANCRELIPASE (LIP-PROT-AMYL) 12000-38000 UNITS PO CPEP
12000.0000 [IU] | ORAL_CAPSULE | Freq: Three times a day (TID) | ORAL | Status: DC
  Administered 2016-11-30 – 2016-12-01 (×4): 12000 [IU] via ORAL
  Filled 2016-11-30 (×6): qty 1

## 2016-11-30 NOTE — ED Notes (Signed)
BEHAVIORAL HEALTH ROUNDING  Patient sleeping: No.  Patient alert and oriented: yes  Behavior appropriate: Yes. ; If no, describe:  Nutrition and fluids offered: Yes  Toileting and hygiene offered: Yes  Sitter present: not applicable, Q 15 min safety rounds and observation.  Law enforcement present: Yes ODS  

## 2016-11-30 NOTE — BH Assessment (Signed)
Writer called and completed DSS Okey Regal) Report.
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
## 2016-11-30 NOTE — ED Notes (Signed)
Pt. Alert and oriented, warm and dry, in no distress. Pt. Denies SI, HI, and AVH. Pt. Encouraged to let nursing staff know of any concerns or needs. 

## 2016-11-30 NOTE — ED Notes (Signed)
Pt taking a shower 

## 2016-11-30 NOTE — ED Notes (Signed)
Pt's mother called. Pt did not wish to speak with her at that time. Pt has been resting comfortably in bed. Maintained on 15 minute checks and observation by security camera for safety.

## 2016-11-30 NOTE — BH Assessment (Signed)
Referral information for Psychiatric Hospitalization faxed to;    [facility] ([phone] or [phone])   [facility] ([phone]),    [facility] ([phone]),    [facility] ([phone]),    [facility] ([phone]),    [facility] ([phone]).  

## 2016-11-30 NOTE — ED Notes (Signed)
ivc 

## 2016-11-30 NOTE — ED Notes (Signed)
Pt calm at this time currently ripping scrubs up into different items  (hair bow, tank top, ect.). RN notified at this time

## 2016-11-30 NOTE — ED Notes (Signed)
Upon request, pt offered phone to call daughter.
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
## 2016-11-30 NOTE — ED Notes (Signed)
TTS to bedside at this time. 

## 2016-11-30 NOTE — ED Notes (Signed)
Pt given the phone to call Madelaine Bhat and her daughter. Pt remains calm and cooperative. Maintained on 15 minute checks and observation by security camera for safety.

## 2016-11-30 NOTE — ED Notes (Signed)
Pt given lunch tray.

## 2016-11-30 NOTE — ED Notes (Signed)
Pt is asking about plan of care, pt states, "I am tired of being stuck in this room, my back hurts, I havent had my medicine."  This RN explained to pt that [facility] psychiatry has recommended psychiatric admission, pt states, "and what if I refuse admission, there is nothing wrong with me, I am not crazy."  This RN explains that she is under IVC and does not have the ability to refuse admission at this time and that I can attempt to get her moved over to [facility] where she has more mobility while we go through the process of getting her admitted.  TTS notified at this time that pt is more cooperative and willing to speak to them; TTS with plans to see her today.

## 2016-11-30 NOTE — ED Notes (Signed)
Pt transported to BHU at this time via NA, Sherry and ODS.

## 2016-11-30 NOTE — ED Notes (Signed)
Patient asleep in room. No noted distress or abnormal behavior. Will continue 15 minute checks and observation by security cameras for safety. 

## 2016-11-30 NOTE — ED Notes (Signed)

## 2016-11-30 NOTE — ED Notes (Signed)
Patient assigned to appropriate care area. Patient oriented to unit/care area: Informed that, for their safety, care areas are designed for safety and monitored by security cameras at all times; and visiting hours explained to patient. Patient verbalizes understanding, and verbal contract for safety obtained. 

## 2016-11-30 NOTE — ED Notes (Signed)
Breakfast tray provided at this time

## 2016-11-30 NOTE — ED Provider Notes (Signed)
-----------------------------------------   5:11 AM on 11/30/2016 -----------------------------------------   Blood pressure 116/85, pulse 71, temperature 98 F (36.7 C), temperature source Oral, resp. rate 17, height  (1.549 m), weight 54.4 kg (120 lb), SpO2 100 %.  Patient required chemical restraint yesterday, has not required any further emergent medications. Currently awaiting psychiatric placement/admission.    Minna Antis, MD 11/30/16 [phone]

## 2016-12-01 DIAGNOSIS — F15950 Other stimulant use, unspecified with stimulant-induced psychotic disorder with delusions: Secondary | ICD-10-CM | POA: Diagnosis not present

## 2016-12-01 DIAGNOSIS — F1510 Other stimulant abuse, uncomplicated: Secondary | ICD-10-CM

## 2016-12-01 NOTE — Discharge Instructions (Signed)
You have been seen in the Emergency Department (ED)  today for a psychiatric complaint.  You have been evaluated by psychiatry and we believe you are safe to be discharged from the hospital.   ° °Please return to the Emergency Department (ED)  immediately if you have ANY thoughts of hurting yourself or anyone else, so that we may help you. ° °Please avoid alcohol and drug use. ° °Follow up with your doctor and/or therapist as soon as possible regarding today's ED  visit.  ° °You may call crisis hotline for [place] at [phone]. ° °

## 2016-12-01 NOTE — BH Assessment (Signed)
[facility]. Writer informed them, the patient is still in the ER and discharge is unknown.  At this time she is still recommended for inpatient treatment. However, she will be seen by the psychiatrist today, to determine if she still needs inpatient or refer for outpatient treatment. DSS states she will like to come visit the patient to follow up with the Report that was filed with them. She states she will come to the ER this morning to see the patient.  Writer informed the patient's nurse Vikki Ports) of the conversation and possible visitation.

## 2016-12-01 NOTE — ED Notes (Signed)
Patient ate 100% of breakfast and beverage, patient is alert and oriented, no behavioral issues, patient is pleasant and cooperative, q 15 minute checks and camera surveillance in progress for safety.
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
## 2016-12-01 NOTE — ED Notes (Signed)
Patient ate 50% of lunch and beverage.  

## 2016-12-01 NOTE — ED Notes (Signed)
Patient is alert and oriented, she will be discharged back home, patient is awaiting discharge paper work, will continue to monitor.

## 2016-12-01 NOTE — ED Notes (Signed)
Patient talked with DSS lady, meeting went calm, patient now talking to her husband on the phone, she is pleasant, denies Si/hi or avh this am, q 15 minute checks and camera surveillance in progress for safety.

## 2016-12-01 NOTE — ED Provider Notes (Signed)
-----------------------------------------   3:14 PM on 12/01/2016 -----------------------------------------   Blood pressure 102/70, pulse 62, temperature 98 F (36.7 C), temperature source Oral, resp. rate 18, height  (1.549 m), weight 54.4 kg (120 lb), SpO2 100 %.  Patient has been evaluated by psychiatry and cleared for discharge. IVC lifted by Dr. Toni Amend. Patient's labs have been reviewed with no acute findings. Patient will be discharged at this time to home     Don Perking, [place], MD 12/01/16 1515

## 2016-12-01 NOTE — ED Provider Notes (Signed)
-----------------------------------------   7:07 AM on 12/01/2016 -----------------------------------------   Blood pressure 102/70, pulse 62, temperature 98 F (36.7 C), temperature source Oral, resp. rate 18, height  (1.549 m), weight 54.4 kg (120 lb), SpO2 100 %.  The patient had no acute events since last update.  Calm and cooperative at this time.  Disposition is pending Psychiatry/Behavioral Medicine team recommendations.     Rebecka Apley, MD 12/01/16 [phone]

## 2016-12-01 NOTE — ED Notes (Signed)
Patient's husband is visiting and visit is calm, no behavioral issues at this time. Visit is supervised.

## 2016-12-01 NOTE — ED Notes (Signed)
Dr. Clapacs talking with patient.  

## 2016-12-01 NOTE — ED Notes (Signed)
Patient alert and oriented, voiced understanding of discharge instructions, all belongings given back to Patient. Patient left with husband calmly and no signs of distress.

## 2016-12-05 MED ORDER — LIPASE-PROTEASE-AMYLASE 3,000-9,500-15,000 UNIT CAPSULE,DELAYED RELEAS
ORAL_CAPSULE | Freq: Three times a day (TID) | ORAL | 11 refills | 0 days | Status: CP
Start: 2016-12-05 — End: 2016-12-16

## 2016-12-16 ENCOUNTER — Ambulatory Visit
Admission: RE | Admit: 2016-12-16 | Discharge: 2016-12-16 | Payer: MEDICAID | Attending: Student in an Organized Health Care Education/Training Program | Admitting: Student in an Organized Health Care Education/Training Program

## 2016-12-16 DIAGNOSIS — F322 Major depressive disorder, single episode, severe without psychotic features: Secondary | ICD-10-CM

## 2016-12-16 DIAGNOSIS — Z720 Tobacco use: Principal | ICD-10-CM

## 2016-12-16 MED ORDER — NICOTINE 7 MG/24 HR DAILY TRANSDERMAL PATCH
MEDICATED_PATCH | TRANSDERMAL | 0 refills | 0.00000 days | Status: CP
Start: 2016-12-16 — End: 2016-12-16

## 2016-12-16 MED ORDER — NICOTINE 7 MG/24 HR DAILY TRANSDERMAL PATCH: each | 0 refills | 0 days

## 2016-12-16 MED ORDER — NICOTINE 14 MG/24 HR DAILY TRANSDERMAL PATCH: each | 0 refills | 0 days

## 2016-12-16 MED ORDER — NICOTINE 21 MG/24 HR DAILY TRANSDERMAL PATCH: each | 0 refills | 0 days

## 2016-12-16 MED ORDER — LIPASE-PROTEASE-AMYLASE 3,000-9,500-15,000 UNIT CAPSULE,DELAYED RELEAS: each | 11 refills | 0 days

## 2016-12-16 MED ORDER — NICOTINE 21 MG/24 HR DAILY TRANSDERMAL PATCH
MEDICATED_PATCH | TRANSDERMAL | 0 refills | 0.00000 days | Status: CP
Start: 2016-12-16 — End: 2016-12-16

## 2016-12-16 MED ORDER — LIPASE-PROTEASE-AMYLASE 3,000-9,500-15,000 UNIT CAPSULE,DELAYED RELEAS
ORAL_CAPSULE | Freq: Three times a day (TID) | ORAL | 11 refills | 0.00000 days | Status: CP
Start: 2016-12-16 — End: 2017-04-28

## 2016-12-16 MED ORDER — NICOTINE 14 MG/24 HR DAILY TRANSDERMAL PATCH
TRANSDERMAL | 0 refills | 0.00000 days | Status: CP
Start: 2016-12-16 — End: 2017-12-16

## 2017-02-03 ENCOUNTER — Ambulatory Visit: Admission: RE | Admit: 2017-02-03 | Discharge: 2017-02-03 | Payer: MEDICAID | Attending: Clinical | Admitting: Clinical

## 2017-02-03 DIAGNOSIS — F322 Major depressive disorder, single episode, severe without psychotic features: Principal | ICD-10-CM

## 2017-02-03 DIAGNOSIS — G4700 Insomnia, unspecified: Secondary | ICD-10-CM

## 2017-02-03 DIAGNOSIS — F419 Anxiety disorder, unspecified: Secondary | ICD-10-CM

## 2017-02-13 ENCOUNTER — Ambulatory Visit
Admission: RE | Admit: 2017-02-13 | Discharge: 2017-02-13 | Payer: MEDICAID | Attending: Student in an Organized Health Care Education/Training Program | Admitting: Student in an Organized Health Care Education/Training Program

## 2017-02-13 DIAGNOSIS — Z720 Tobacco use: Secondary | ICD-10-CM

## 2017-02-13 DIAGNOSIS — F322 Major depressive disorder, single episode, severe without psychotic features: Principal | ICD-10-CM

## 2017-02-13 MED ORDER — NICOTINE (POLACRILEX) 4 MG GUM: each | 6 refills | 0 days

## 2017-02-13 MED ORDER — DULOXETINE 20 MG CAPSULE,DELAYED RELEASE
Freq: Every day | ORAL | 2 refills | 0.00000 days | Status: CP
Start: 2017-02-13 — End: 2017-02-13

## 2017-02-13 MED ORDER — NICOTINE (POLACRILEX) 4 MG GUM
BUCCAL | 6 refills | 0 days | Status: CP | PRN
Start: 2017-02-13 — End: 2017-02-13

## 2017-02-13 MED ORDER — DULOXETINE 20 MG CAPSULE,DELAYED RELEASE: 20 mg | each | 2 refills | 0 days

## 2017-02-13 MED FILL — DULOXETINE/20MG/CPEP: DULOXETINE/20MG/CPEP | 30 days supply | Qty: 30 | Fill #0

## 2017-03-06 ENCOUNTER — Ambulatory Visit: Admission: RE | Admit: 2017-03-06 | Discharge: 2017-03-06 | Payer: MEDICAID | Attending: Clinical | Admitting: Clinical

## 2017-03-06 DIAGNOSIS — G4700 Insomnia, unspecified: Secondary | ICD-10-CM

## 2017-03-06 DIAGNOSIS — F419 Anxiety disorder, unspecified: Principal | ICD-10-CM

## 2017-03-06 DIAGNOSIS — Z87828 Personal history of other (healed) physical injury and trauma: Secondary | ICD-10-CM

## 2017-03-06 MED ORDER — DULOXETINE 30 MG CAPSULE,DELAYED RELEASE: 30 mg | each | 1 refills | 0 days

## 2017-03-06 MED ORDER — DULOXETINE 30 MG CAPSULE,DELAYED RELEASE
ORAL_CAPSULE | Freq: Every day | ORAL | 1 refills | 0 days | Status: CP
Start: 2017-03-06 — End: 2017-03-30

## 2017-03-13 MED FILL — CREON/3000UNIT/CPEP: CREON/3000UNIT/CPEP | 30 days supply | Qty: 90 | Fill #0

## 2017-03-30 ENCOUNTER — Ambulatory Visit
Admit: 2017-03-30 | Discharge: 2017-03-31 | Payer: MEDICAID | Attending: Student in an Organized Health Care Education/Training Program | Primary: Student in an Organized Health Care Education/Training Program

## 2017-03-30 DIAGNOSIS — K8500 Idiopathic acute pancreatitis without necrosis or infection: Secondary | ICD-10-CM

## 2017-03-30 DIAGNOSIS — F329 Major depressive disorder, single episode, unspecified: Principal | ICD-10-CM

## 2017-03-30 MED ORDER — PROCHLORPERAZINE MALEATE 5 MG TABLET
ORAL_TABLET | Freq: Three times a day (TID) | ORAL | 3 refills | 0 days | Status: CP | PRN
Start: 2017-03-30 — End: 2017-09-04

## 2017-03-30 MED ORDER — FAMOTIDINE 20 MG TABLET
ORAL_TABLET | Freq: Two times a day (BID) | ORAL | 2 refills | 0 days | Status: CP | PRN
Start: 2017-03-30 — End: 2017-05-14

## 2017-04-07 ENCOUNTER — Ambulatory Visit: Admit: 2017-04-07 | Discharge: 2017-04-08 | Payer: MEDICAID | Attending: Clinical | Primary: Clinical

## 2017-04-07 DIAGNOSIS — G4700 Insomnia, unspecified: Secondary | ICD-10-CM

## 2017-04-07 DIAGNOSIS — F419 Anxiety disorder, unspecified: Principal | ICD-10-CM

## 2017-04-07 DIAGNOSIS — Z87828 Personal history of other (healed) physical injury and trauma: Secondary | ICD-10-CM

## 2017-04-28 ENCOUNTER — Ambulatory Visit
Admit: 2017-04-28 | Discharge: 2017-04-29 | Payer: MEDICAID | Attending: Student in an Organized Health Care Education/Training Program | Primary: Student in an Organized Health Care Education/Training Program

## 2017-04-28 DIAGNOSIS — F329 Major depressive disorder, single episode, unspecified: Principal | ICD-10-CM

## 2017-04-28 DIAGNOSIS — K860 Alcohol-induced chronic pancreatitis: Secondary | ICD-10-CM

## 2017-04-28 MED ORDER — FLUOXETINE 10 MG TABLET
ORAL_TABLET | Freq: Every day | ORAL | 3 refills | 0 days | Status: CP
Start: 2017-04-28 — End: 2017-05-14

## 2017-04-28 MED ORDER — LIPASE-PROTEASE-AMYLASE 3,000-9,500-15,000 UNIT CAPSULE,DELAYED RELEAS
ORAL_CAPSULE | Freq: Three times a day (TID) | ORAL | 11 refills | 0.00000 days | Status: CP
Start: 2017-04-28 — End: 2018-05-26

## 2017-05-14 ENCOUNTER — Ambulatory Visit
Admit: 2017-05-14 | Discharge: 2017-05-15 | Payer: MEDICAID | Attending: Student in an Organized Health Care Education/Training Program | Primary: Student in an Organized Health Care Education/Training Program

## 2017-05-14 DIAGNOSIS — K8500 Idiopathic acute pancreatitis without necrosis or infection: Principal | ICD-10-CM

## 2017-05-14 MED ORDER — FAMOTIDINE 20 MG TABLET: tablet | 2 refills | 0 days

## 2017-05-14 MED ORDER — DOCUSATE SODIUM 100 MG CAPSULE: 100 mg | capsule | Freq: Every day | 6 refills | 0 days | Status: AC

## 2017-05-14 MED ORDER — FAMOTIDINE 20 MG TABLET
ORAL_TABLET | Freq: Two times a day (BID) | ORAL | 2 refills | 0.00000 days | Status: CP | PRN
Start: 2017-05-14 — End: 2018-05-14

## 2017-05-14 MED ORDER — DOCUSATE SODIUM 100 MG CAPSULE
ORAL_CAPSULE | Freq: Every day | ORAL | 6 refills | 0.00000 days | Status: CP | PRN
Start: 2017-05-14 — End: 2017-05-14

## 2017-05-14 MED FILL — DOCUSATE SOD./100MG/CAP: DOCUSATE SOD./100MG/CAP | 30 days supply | Qty: 30 | Fill #0

## 2017-05-14 MED FILL — FAMOTIDINE/20MG/TAB: FAMOTIDINE/20MG/TAB | 15 days supply | Qty: 60 | Fill #0

## 2017-06-02 ENCOUNTER — Ambulatory Visit
Admit: 2017-06-02 | Discharge: 2017-06-03 | Payer: MEDICAID | Attending: Student in an Organized Health Care Education/Training Program | Primary: Student in an Organized Health Care Education/Training Program

## 2017-06-02 DIAGNOSIS — R41840 Attention and concentration deficit: Principal | ICD-10-CM

## 2017-07-21 ENCOUNTER — Ambulatory Visit: Admit: 2017-07-21 | Discharge: 2017-07-22 | Attending: Clinical | Primary: Clinical

## 2017-07-21 DIAGNOSIS — T7491XS Unspecified adult maltreatment, confirmed, sequela: Secondary | ICD-10-CM

## 2017-07-21 DIAGNOSIS — F419 Anxiety disorder, unspecified: Principal | ICD-10-CM

## 2017-07-21 DIAGNOSIS — G4700 Insomnia, unspecified: Secondary | ICD-10-CM

## 2017-09-04 ENCOUNTER — Ambulatory Visit
Admit: 2017-09-04 | Discharge: 2017-09-05 | Payer: MEDICAID | Attending: Student in an Organized Health Care Education/Training Program | Primary: Student in an Organized Health Care Education/Training Program

## 2017-09-04 DIAGNOSIS — K8500 Idiopathic acute pancreatitis without necrosis or infection: Secondary | ICD-10-CM

## 2017-09-04 DIAGNOSIS — F419 Anxiety disorder, unspecified: Principal | ICD-10-CM

## 2017-09-04 MED ORDER — PROCHLORPERAZINE MALEATE 5 MG TABLET
ORAL_TABLET | Freq: Three times a day (TID) | ORAL | 3 refills | 0.00000 days | Status: CP | PRN
Start: 2017-09-04 — End: 2017-09-04

## 2017-09-04 MED ORDER — OMEPRAZOLE 20 MG CAPSULE,DELAYED RELEASE
ORAL_CAPSULE | Freq: Every day | ORAL | 9 refills | 0.00000 days | Status: CP
Start: 2017-09-04 — End: 2017-09-04

## 2017-09-04 MED ORDER — OMEPRAZOLE 20 MG CAPSULE,DELAYED RELEASE: 20 mg | capsule | 9 refills | 0 days

## 2017-09-04 MED ORDER — PROCHLORPERAZINE MALEATE 5 MG TABLET: 5 mg | tablet | 3 refills | 0 days

## 2017-09-04 MED FILL — PROCHLORPERAZINE MALEATE/5MG/TABS: PROCHLORPERAZINE MALEATE/5MG/TABS | 10 days supply | Qty: 30 | Fill #0

## 2017-09-04 MED FILL — OMEPRAZOLE 20MG/20MG/CPDR: OMEPRAZOLE 20MG/20MG/CPDR | 30 days supply | Qty: 30 | Fill #0

## 2017-10-27 MED ORDER — OMEPRAZOLE 20 MG CAPSULE,DELAYED RELEASE
ORAL_CAPSULE | Freq: Every day | ORAL | 5 refills | 0 days | Status: CP
Start: 2017-10-27 — End: 2018-07-06

## 2017-11-23 ENCOUNTER — Ambulatory Visit
Admit: 2017-11-23 | Discharge: 2017-11-24 | Payer: MEDICAID | Attending: Student in an Organized Health Care Education/Training Program | Primary: Student in an Organized Health Care Education/Training Program

## 2017-11-23 DIAGNOSIS — F319 Bipolar disorder, unspecified: Principal | ICD-10-CM

## 2017-11-23 DIAGNOSIS — Z90411 Acquired partial absence of pancreas: Secondary | ICD-10-CM

## 2017-11-23 DIAGNOSIS — Z8719 Personal history of other diseases of the digestive system: Secondary | ICD-10-CM

## 2017-11-23 DIAGNOSIS — F329 Major depressive disorder, single episode, unspecified: Secondary | ICD-10-CM

## 2017-11-23 MED ORDER — QUETIAPINE 50 MG TABLET
ORAL_TABLET | Freq: Every evening | ORAL | 1 refills | 0 days | Status: CP
Start: 2017-11-23 — End: 2018-01-06
  Filled 2017-11-23: qty 30, 30d supply, fill #0

## 2017-11-23 MED ORDER — PROMETHAZINE 12.5 MG TABLET
ORAL_TABLET | Freq: Four times a day (QID) | ORAL | 3 refills | 0 days | Status: CP | PRN
Start: 2017-11-23 — End: ?
  Filled 2017-11-23: qty 30, 7d supply, fill #0

## 2017-11-23 MED ORDER — CALCIUM CARBONATE 1,000 MG-SIMETHICONE 60 MG CHEWABLE TABLET
ORAL_TABLET | Freq: Three times a day (TID) | ORAL | 3 refills | 0.00000 days | Status: CP | PRN
Start: 2017-11-23 — End: ?

## 2017-11-23 MED ORDER — DEXTROMETHORPHAN-GUAIFENESIN 10 MG-200 MG CAPSULE
Freq: Every day | ORAL | 0 refills | 0 days | Status: CP | PRN
Start: 2017-11-23 — End: ?

## 2017-11-23 MED FILL — QUETIAPINE 50 MG TABLET: 30 days supply | Qty: 30 | Fill #0 | Status: AC

## 2017-11-23 MED FILL — PROMETHAZINE 12.5 MG TABLET: 7 days supply | Qty: 30 | Fill #0 | Status: AC

## 2017-12-14 ENCOUNTER — Ambulatory Visit
Admit: 2017-12-14 | Discharge: 2017-12-15 | Payer: MEDICAID | Attending: Psychiatric/Mental Health | Primary: Psychiatric/Mental Health

## 2017-12-14 DIAGNOSIS — F329 Major depressive disorder, single episode, unspecified: Secondary | ICD-10-CM

## 2017-12-14 DIAGNOSIS — F319 Bipolar disorder, unspecified: Principal | ICD-10-CM

## 2017-12-14 MED ORDER — LAMOTRIGINE 25 MG TABLET
ORAL_TABLET | 0 refills | 0 days | Status: CP
Start: 2017-12-14 — End: 2018-02-10

## 2018-01-06 ENCOUNTER — Ambulatory Visit
Admit: 2018-01-06 | Discharge: 2018-01-07 | Payer: MEDICAID | Attending: Psychiatric/Mental Health | Primary: Psychiatric/Mental Health

## 2018-01-06 DIAGNOSIS — F319 Bipolar disorder, unspecified: Principal | ICD-10-CM

## 2018-01-06 MED ORDER — ARIPIPRAZOLE 5 MG TABLET
ORAL_TABLET | 1 refills | 0 days | Status: CP
Start: 2018-01-06 — End: 2018-02-10

## 2018-01-14 MED ORDER — LAMOTRIGINE 100 MG TABLET
ORAL_TABLET | 1 refills | 0 days | Status: CP
Start: 2018-01-14 — End: 2018-02-10

## 2018-01-20 ENCOUNTER — Ambulatory Visit
Admit: 2018-01-20 | Discharge: 2018-01-21 | Payer: MEDICAID | Attending: Psychiatric/Mental Health | Primary: Psychiatric/Mental Health

## 2018-01-20 DIAGNOSIS — F319 Bipolar disorder, unspecified: Principal | ICD-10-CM

## 2018-02-10 ENCOUNTER — Ambulatory Visit
Admit: 2018-02-10 | Discharge: 2018-02-11 | Payer: MEDICAID | Attending: Psychiatric/Mental Health | Primary: Psychiatric/Mental Health

## 2018-02-10 DIAGNOSIS — F319 Bipolar disorder, unspecified: Principal | ICD-10-CM

## 2018-02-10 MED ORDER — LAMOTRIGINE 100 MG TABLET
ORAL_TABLET | 1 refills | 0 days | Status: CP
Start: 2018-02-10 — End: 2018-04-27

## 2018-02-10 MED ORDER — HYDROXYZINE HCL 25 MG TABLET
ORAL_TABLET | 0 refills | 0 days | Status: CP
Start: 2018-02-10 — End: 2018-04-26

## 2018-04-07 ENCOUNTER — Ambulatory Visit
Admit: 2018-04-07 | Discharge: 2018-04-08 | Payer: MEDICAID | Attending: Student in an Organized Health Care Education/Training Program | Primary: Student in an Organized Health Care Education/Training Program

## 2018-04-07 DIAGNOSIS — Z90411 Acquired partial absence of pancreas: Secondary | ICD-10-CM

## 2018-04-07 DIAGNOSIS — F329 Major depressive disorder, single episode, unspecified: Secondary | ICD-10-CM

## 2018-04-07 DIAGNOSIS — K5909 Other constipation: Principal | ICD-10-CM

## 2018-04-07 MED ORDER — LACTULOSE 10 GRAM/15 ML (15 ML) ORAL SOLUTION
Freq: Every day | ORAL | 0 refills | 0 days | Status: CP | PRN
Start: 2018-04-07 — End: ?

## 2018-04-26 ENCOUNTER — Ambulatory Visit
Admit: 2018-04-26 | Discharge: 2018-04-27 | Payer: MEDICAID | Attending: Psychiatric/Mental Health | Primary: Psychiatric/Mental Health

## 2018-04-26 DIAGNOSIS — F319 Bipolar disorder, unspecified: Principal | ICD-10-CM

## 2018-04-26 MED ORDER — HYDROXYZINE HCL 50 MG TABLET
ORAL_TABLET | Freq: Four times a day (QID) | ORAL | 1 refills | 0.00000 days | Status: CP | PRN
Start: 2018-04-26 — End: 2018-06-25

## 2018-04-26 MED ORDER — RISPERIDONE 0.25 MG TABLET
ORAL_TABLET | Freq: Two times a day (BID) | ORAL | 1 refills | 0 days | Status: CP
Start: 2018-04-26 — End: 2018-05-13

## 2018-05-13 ENCOUNTER — Ambulatory Visit
Admit: 2018-05-13 | Discharge: 2018-05-14 | Payer: MEDICAID | Attending: Psychiatric/Mental Health | Primary: Psychiatric/Mental Health

## 2018-05-13 DIAGNOSIS — F329 Major depressive disorder, single episode, unspecified: Principal | ICD-10-CM

## 2018-05-13 MED ORDER — BUSPIRONE 10 MG TABLET
ORAL_TABLET | Freq: Three times a day (TID) | ORAL | 1 refills | 0.00000 days | Status: CP
Start: 2018-05-13 — End: 2018-06-17

## 2018-05-26 DIAGNOSIS — K860 Alcohol-induced chronic pancreatitis: Principal | ICD-10-CM

## 2018-05-26 MED ORDER — LIPASE-PROTEASE-AMYLASE 3,000-9,500-15,000 UNIT CAPSULE,DELAYED RELEAS
ORAL_CAPSULE | Freq: Three times a day (TID) | ORAL | 11 refills | 0 days | Status: CP
Start: 2018-05-26 — End: 2018-06-17

## 2018-06-07 ENCOUNTER — Ambulatory Visit
Admit: 2018-06-07 | Discharge: 2018-06-08 | Payer: MEDICAID | Attending: Student in an Organized Health Care Education/Training Program | Primary: Student in an Organized Health Care Education/Training Program

## 2018-06-07 DIAGNOSIS — Z09 Encounter for follow-up examination after completed treatment for conditions other than malignant neoplasm: Principal | ICD-10-CM

## 2018-06-17 ENCOUNTER — Institutional Professional Consult (permissible substitution): Admit: 2018-06-17 | Discharge: 2018-06-18 | Payer: MEDICAID | Attending: Clinical | Primary: Clinical

## 2018-06-17 ENCOUNTER — Institutional Professional Consult (permissible substitution)
Admit: 2018-06-17 | Discharge: 2018-06-18 | Payer: MEDICAID | Attending: Psychiatric/Mental Health | Primary: Psychiatric/Mental Health

## 2018-06-17 DIAGNOSIS — F3181 Bipolar II disorder: Principal | ICD-10-CM

## 2018-06-17 DIAGNOSIS — F329 Major depressive disorder, single episode, unspecified: Principal | ICD-10-CM

## 2018-06-17 MED ORDER — BUSPIRONE 15 MG TABLET
ORAL_TABLET | Freq: Three times a day (TID) | ORAL | 0 refills | 0 days | Status: CP
Start: 2018-06-17 — End: 2018-08-17

## 2018-06-17 MED ORDER — LIPASE-PROTEASE-AMYLASE 3,000-9,500-15,000 UNIT CAPSULE,DELAYED RELEAS
ORAL_CAPSULE | Freq: Three times a day (TID) | ORAL | 11 refills | 0.00000 days | Status: CP
Start: 2018-06-17 — End: 2018-08-12

## 2018-06-24 ENCOUNTER — Institutional Professional Consult (permissible substitution): Admit: 2018-06-24 | Discharge: 2018-06-25 | Payer: MEDICAID | Attending: Clinical | Primary: Clinical

## 2018-06-24 DIAGNOSIS — F3181 Bipolar II disorder: Principal | ICD-10-CM

## 2018-06-27 ENCOUNTER — Other Ambulatory Visit: Payer: Self-pay

## 2018-06-27 ENCOUNTER — Emergency Department
Admission: EM | Admit: 2018-06-27 | Discharge: 2018-06-28 | Disposition: A | Discharge status: Other Acute Inpatient Hospital | Payer: Medicaid Other | Attending: Student in an Organized Health Care Education/Training Program | Admitting: Student in an Organized Health Care Education/Training Program

## 2018-06-27 DIAGNOSIS — R462 Strange and inexplicable behavior: Secondary | ICD-10-CM

## 2018-06-27 DIAGNOSIS — F3181 Bipolar II disorder: Secondary | ICD-10-CM | POA: Insufficient documentation

## 2018-06-27 DIAGNOSIS — F17200 Nicotine dependence, unspecified, uncomplicated: Secondary | ICD-10-CM | POA: Insufficient documentation

## 2018-06-27 DIAGNOSIS — Z79899 Other long term (current) drug therapy: Secondary | ICD-10-CM | POA: Diagnosis not present

## 2018-06-27 DIAGNOSIS — F419 Anxiety disorder, unspecified: Secondary | ICD-10-CM | POA: Insufficient documentation

## 2018-06-27 DIAGNOSIS — R461 Bizarre personal appearance: Secondary | ICD-10-CM | POA: Diagnosis present

## 2018-06-27 DIAGNOSIS — F15950 Other stimulant use, unspecified with stimulant-induced psychotic disorder with delusions: Secondary | ICD-10-CM

## 2018-06-27 LAB — URINE DRUG SCREEN, QUALITATIVE (ARMC ONLY)
Amphetamines, Ur Screen: POSITIVE — AB
Barbiturates, Ur Screen: NOT DETECTED
Benzodiazepine, Ur Scrn: NOT DETECTED
Cannabinoid 50 Ng, Ur ~~LOC~~: NOT DETECTED
Cocaine Metabolite,Ur ~~LOC~~: NOT DETECTED
MDMA (Ecstasy)Ur Screen: NOT DETECTED
Methadone Scn, Ur: NOT DETECTED
Opiate, Ur Screen: NOT DETECTED
Phencyclidine (PCP) Ur S: NOT DETECTED
Tricyclic, Ur Screen: NOT DETECTED

## 2018-06-27 LAB — COMPREHENSIVE METABOLIC PANEL
ALT: 12 U/L (ref 0–44)
AST: 21 U/L (ref 15–41)
Albumin: 4.4 g/dL (ref 3.5–5.0)
Alkaline Phosphatase: 56 U/L (ref 38–126)
Anion gap: 10 (ref 5–15)
BUN: 11 mg/dL (ref 6–20)
CO2: 23 mmol/L (ref 22–32)
Calcium: 9 mg/dL (ref 8.9–10.3)
Chloride: 104 mmol/L (ref 98–111)
Creatinine, Ser: 0.62 mg/dL (ref 0.44–1.00)
GFR calc Af Amer: >60 mL/min (ref >60)
GFR calc non Af Amer: >60 mL/min (ref >60)
Glucose, Bld: 128 mg/dL — ABNORMAL HIGH (ref 70–99)
Potassium: 4.1 mmol/L (ref 3.5–5.1)
Sodium: 137 mmol/L (ref 135–145)
Total Bilirubin: 0.6 mg/dL (ref 0.3–1.2)
Total Protein: 7.8 g/dL (ref 6.5–8.1)

## 2018-06-27 LAB — SALICYLATE LEVEL: Salicylate Lvl: <7 mg/dL (ref 2.8–30.0)

## 2018-06-27 LAB — CBC
HCT: 48 % — ABNORMAL HIGH (ref 36.0–46.0)
Hemoglobin: 15.5 g/dL — ABNORMAL HIGH (ref 12.0–15.0)
MCH: 32.2 pg (ref 26.0–34.0)
MCHC: 32.3 g/dL (ref 30.0–36.0)
MCV: 99.8 fL (ref 80.0–100.0)
Platelets: 189 10*3/uL (ref 150–400)
RBC: 4.81 MIL/uL (ref 3.87–5.11)
RDW: 14.7 % (ref 11.5–15.5)
WBC: 10.7 10*3/uL — ABNORMAL HIGH (ref 4.0–10.5)
nRBC: 0 % (ref 0.0–0.2)

## 2018-06-27 LAB — POCT PREGNANCY, URINE: Preg Test, Ur: NEGATIVE

## 2018-06-27 LAB — ETHANOL: Alcohol, Ethyl (B): <10 mg/dL (ref <10)

## 2018-06-27 LAB — ACETAMINOPHEN LEVEL: Acetaminophen (Tylenol), Serum: <10 ug/mL — ABNORMAL LOW (ref 10–30)

## 2018-06-27 MED ORDER — LORAZEPAM 1 MG PO TABS
1.0000 mg | ORAL_TABLET | Freq: Once | ORAL | Status: AC
  Administered 2018-06-27: 21:00:00 1 mg via ORAL
  Filled 2018-06-27: qty 1

## 2018-06-27 NOTE — ED Notes (Signed)
Hourly rounding reveals patient in room. No complaints, stable, in no acute distress. Q15 minute rounds and monitoring via Rover and Officer to continue.   

## 2018-06-27 NOTE — ED Notes (Signed)
Hourly rounding reveals patient in room still tearfull and paranoid.. No complaints, stable, in no acute distress. Q15 minute rounds and monitoring via Psychologist, counselling to continue.
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
## 2018-06-27 NOTE — ED Notes (Signed)
Dr. Roxan Hockey is at bedside.

## 2018-06-27 NOTE — ED Notes (Signed)
Hourly rounding reveals patient in room talking to TTS on camera. No complaints, stable, in no acute distress. Q15 minute rounds and monitoring via Psychologist, counselling to continue.

## 2018-06-27 NOTE — ED Notes (Signed)
Pt. Transferred from room 22 from room 25. Report to include Situation, Background, Assessment and Recommendations from [facility]. Pt. Oriented to Quad including Q15 minute rounds as well as Psychologist, counselling for their protection. Patient is alert and oriented, warm and dry in no acute distress. Patient denies SI, HI, and AVH. Pt. Encouraged to let me know if needs arise.

## 2018-06-27 NOTE — ED Notes (Signed)
Hourly rounding reveals patient sleeping in room. No complaints, stable, in no acute distress. Q15 minute rounds and monitoring via Rover and Officer to continue.  
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
## 2018-06-27 NOTE — BH Assessment (Signed)
TTS is currently conducting tele-assessments. TTS is available at any time to assess this pt. The number to the machine is [phone]
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
## 2018-06-28 ENCOUNTER — Inpatient Hospital Stay (HOSPITAL_COMMUNITY)
Admission: AD | Admit: 2018-06-28 | Discharge: 2018-07-01 | DRG: 897 | Disposition: A | Discharge status: Home / Self Care | Payer: Medicaid Other | Source: Intra-hospital | Attending: Psychiatry | Admitting: Psychiatry

## 2018-06-28 ENCOUNTER — Encounter (HOSPITAL_COMMUNITY): Payer: Self-pay

## 2018-06-28 DIAGNOSIS — F17200 Nicotine dependence, unspecified, uncomplicated: Secondary | ICD-10-CM | POA: Diagnosis present

## 2018-06-28 DIAGNOSIS — K219 Gastro-esophageal reflux disease without esophagitis: Secondary | ICD-10-CM | POA: Diagnosis present

## 2018-06-28 DIAGNOSIS — Z79899 Other long term (current) drug therapy: Secondary | ICD-10-CM | POA: Diagnosis not present

## 2018-06-28 DIAGNOSIS — F419 Anxiety disorder, unspecified: Secondary | ICD-10-CM | POA: Diagnosis present

## 2018-06-28 DIAGNOSIS — F15950 Other stimulant use, unspecified with stimulant-induced psychotic disorder with delusions: Secondary | ICD-10-CM | POA: Diagnosis not present

## 2018-06-28 DIAGNOSIS — F15150 Other stimulant abuse with stimulant-induced psychotic disorder with delusions: Secondary | ICD-10-CM | POA: Diagnosis present

## 2018-06-28 MED ORDER — BENZTROPINE MESYLATE 1 MG PO TABS: 1.0000 mg | ORAL_TABLET | Freq: Every day | ORAL | Status: DC

## 2018-06-28 MED ORDER — ALUM & MAG HYDROXIDE-SIMETH 200-200-20 MG/5ML PO SUSP: 30.0000 mL | ORAL | Status: DC | PRN

## 2018-06-28 MED ORDER — PANTOPRAZOLE SODIUM 40 MG PO TBEC: 40.0000 mg | DELAYED_RELEASE_TABLET | Freq: Every day | ORAL | Status: DC

## 2018-06-28 MED ORDER — PANTOPRAZOLE SODIUM 40 MG PO TBEC
40.0000 mg | DELAYED_RELEASE_TABLET | Freq: Every day | ORAL | Status: DC
  Administered 2018-06-29 – 2018-07-01 (×3): 40 mg via ORAL
  Filled 2018-06-28 (×5): qty 1

## 2018-06-28 MED ORDER — BENZTROPINE MESYLATE 1 MG PO TABS
1.0000 mg | ORAL_TABLET | Freq: Every day | ORAL | Status: DC
  Administered 2018-06-29 – 2018-06-30 (×2): 1 mg via ORAL
  Filled 2018-06-28 (×3): qty 1

## 2018-06-28 MED ORDER — GABAPENTIN 300 MG PO CAPS: 300.0000 mg | ORAL_CAPSULE | Freq: Three times a day (TID) | ORAL | Status: DC

## 2018-06-28 MED ORDER — HALOPERIDOL 5 MG PO TABS: 5.0000 mg | ORAL_TABLET | Freq: Two times a day (BID) | ORAL | Status: DC

## 2018-06-28 MED ORDER — ACETAMINOPHEN 325 MG PO TABS: 650.0000 mg | ORAL_TABLET | Freq: Four times a day (QID) | ORAL | Status: DC | PRN

## 2018-06-28 MED ORDER — PANCRELIPASE (LIP-PROT-AMYL) 3000-9500 UNITS PO CPEP: 1.0000 | ORAL_CAPSULE | Freq: Three times a day (TID) | ORAL | Status: DC

## 2018-06-28 MED ORDER — BUSPIRONE HCL 5 MG PO TABS: 15.0000 mg | ORAL_TABLET | Freq: Three times a day (TID) | ORAL | Status: DC

## 2018-06-28 MED ORDER — HALOPERIDOL 5 MG PO TABS
5.0000 mg | ORAL_TABLET | Freq: Two times a day (BID) | ORAL | Status: DC
  Administered 2018-06-28 – 2018-07-01 (×6): 5 mg via ORAL
  Filled 2018-06-28 (×11): qty 1

## 2018-06-28 MED ORDER — BUSPIRONE HCL 15 MG PO TABS
15.0000 mg | ORAL_TABLET | Freq: Three times a day (TID) | ORAL | Status: DC
  Administered 2018-06-29 – 2018-07-01 (×8): 15 mg via ORAL
  Filled 2018-06-28 (×13): qty 1

## 2018-06-28 MED ORDER — GABAPENTIN 300 MG PO CAPS
300.0000 mg | ORAL_CAPSULE | Freq: Three times a day (TID) | ORAL | Status: DC
  Administered 2018-06-29 – 2018-07-01 (×8): 300 mg via ORAL
  Filled 2018-06-28 (×13): qty 1

## 2018-06-28 MED ORDER — TRAZODONE HCL 50 MG PO TABS
50.0000 mg | ORAL_TABLET | Freq: Every evening | ORAL | Status: DC | PRN
  Administered 2018-06-30 (×2): 50 mg via ORAL
  Filled 2018-06-28: qty 1

## 2018-06-28 MED ORDER — MAGNESIUM HYDROXIDE 400 MG/5ML PO SUSP: 30.0000 mL | Freq: Every day | ORAL | Status: DC | PRN

## 2018-06-28 NOTE — ED Notes (Signed)
Hourly rounding reveals patient sleeping in room. No complaints, stable, in no acute distress. Q15 minute rounds and monitoring via Rover and Officer to continue.  

## 2018-06-28 NOTE — ED Notes (Signed)
Patient resting quietly in room. No noted distress or abnormal behaviors noted. Will continue 15 minute checks and observation by security camera for safety.Patient resting quietly in room. No noted distress or abnormal behaviors noted. Will continue 15 minute checks and observation by security camera for safety.

## 2018-06-28 NOTE — ED Notes (Signed)
Pt discharged under IVC to [facility]. VS stable. Report given to RN at [facility]. All belongings given to officer. Pt accepting of disposition.

## 2018-06-28 NOTE — Tx Team (Signed)
Initial Treatment Plan 06/28/2018 6:11 PM Luther Parody Trumbo ONG:[id number]    PATIENT STRESSORS: Financial difficulties Legal issue Marital or family conflict   PATIENT STRENGTHS: Capable of independent living General fund of knowledge Motivation for treatment/growth Physical Health   PATIENT IDENTIFIED PROBLEMS: "figure out safe place"  Anxiety  paranoia                 DISCHARGE CRITERIA:  Ability to meet basic life and health needs Adequate post-discharge living arrangements Improved stabilization in mood, thinking, and/or behavior  PRELIMINARY DISCHARGE PLAN: Attend aftercare/continuing care group Attend PHP/IOP Outpatient therapy Participate in family therapy Placement in alternative living arrangements  PATIENT/FAMILY INVOLVEMENT: This treatment plan has been presented to and reviewed with the patient, BERT BARNABY.  The patient and family have been given the opportunity to ask questions and make suggestions.  Raylene Miyamoto, RN 06/28/2018, 6:11 PM

## 2018-06-28 NOTE — ED Notes (Signed)
Hourly rounding reveals patient in room. Stable, in no acute distress. Q15 minute rounds and monitoring via Rover and Officer to continue.  

## 2018-06-28 NOTE — ED Notes (Signed)
Pt heard yelling from nurses station. RN went to patient's room. Pt stated, "can't I talk to myself?"  Pt denies any needs at this time. Maintained on 15 minute checks and observation by security camera for safety.

## 2018-06-28 NOTE — ED Notes (Signed)
emtala reviewed by charge RN 

## 2018-06-28 NOTE — BH Assessment (Signed)
Patient has been accepted to [facility].  Patient assigned to room 503-1 Accepting physician is Dr. Jeannine Kitten.  Call report to [phone].  Representative was [employer].   ER Staff is aware of it:  Misty Stanley, ER Secretary  Dr. Alphonzo Lemmings, ER MD  Amy B., Patient's Nurse
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
## 2018-06-28 NOTE — ED Provider Notes (Signed)
-----------------------------------------   3:53 AM on 06/28/2018 -----------------------------------------   Blood pressure 123/80, pulse 89, temperature 98 F (36.7 C), temperature source Oral, resp. rate 18, height 1.549 m (5\' 1" ), weight 59 kg, last menstrual period 06/09/2018, SpO2 97 %.  The patient is calm and cooperative at this time.  There have been no acute events since the last update.  Awaiting disposition plan from Behavioral Medicine team.   Loleta Rose, MD 06/28/18 [phone]

## 2018-06-28 NOTE — Progress Notes (Signed)
D: Pt denies SI/HI/AVH. Pt is pleasant and cooperative. Pt stated she was doing ok, pt getting used to the unit this evening.  A: Pt was offered support and encouragement. Pt was given scheduled medications. Pt was encourage to attend groups. Q 15 minute checks were done for safety.  R:Pt attends groups and interacts well with peers and staff. Pt is taking medication. Pt has no complaints.Pt receptive to treatment and safety maintained on unit.  Problem: Education: Goal: Emotional status will improve Outcome: Progressing

## 2018-06-29 DIAGNOSIS — F15950 Other stimulant use, unspecified with stimulant-induced psychotic disorder with delusions: Secondary | ICD-10-CM

## 2018-06-29 MED ORDER — TEMAZEPAM 15 MG PO CAPS
30.0000 mg | ORAL_CAPSULE | Freq: Every day | ORAL | Status: DC
  Filled 2018-06-29: qty 2

## 2018-06-29 NOTE — Plan of Care (Signed)
  Problem: Coping: Goal: Ability to identify and develop effective coping behavior will improve Outcome: Progressing   D: Pt alert and oriented on the unit. Pt sat in the dayroom during the morning. Pt isolated in her room for the remainder of the day asleep in bed.  Pt denies SI/HI, A/VH. Pt's affect was flat and mood depressed. Pt is pleasant and cooperative. A: Education, support and encouragement provided, q15 minute safety checks remain in effect. Medications administered per MD orders. R: No reactions/side effects to medicine noted. Pt denies any concerns at this time, and verbally contracts for safety. Pt ambulating on the unit with no issues. Pt remains safe on and off the unit.

## 2018-06-29 NOTE — Progress Notes (Signed)
Ericia appears flat in affect and mood. Pt denies SI/HI/AVH/Pain at this time. Pt was superficial/cautious with interaction. No new c/o's. Pt refused scheduled Restoril HS; stating she already feels tired. Support offered. Will continue with POC.
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
## 2018-06-29 NOTE — Progress Notes (Signed)
Patient ID: Tamara Nguyen, female   DOB: [birth year], 38 y.o.   MRN: [id number]   [place] NOVEL CORONAVIRUS (COVID-19) DAILY CHECK-OFF SYMPTOMS - answer yes or no to each - every day NO YES  Have you had a fever in the past 24 hours?  . Fever (Temp > 37.80C / 100F) X   Have you had any of these symptoms in the past 24 hours? . New Cough .  Sore Throat  .  Shortness of Breath .  Difficulty Breathing .  Unexplained Body Aches   X   Have you had any one of these symptoms in the past 24 hours not related to allergies?   . Runny Nose .  Nasal Congestion .  Sneezing   X   If you have had runny nose, nasal congestion, sneezing in the past 24 hours, has it worsened?  X   EXPOSURES - check yes or no X   Have you traveled outside the state in the past 14 days?  X   Have you been in contact with someone with a confirmed diagnosis of COVID-19 or PUI in the past 14 days without wearing appropriate PPE?  X   Have you been living in the same home as a person with confirmed diagnosis of COVID-19 or a PUI (household contact)?    X   Have you been diagnosed with COVID-19?    X              What to do next: Answered NO to all: Answered YES to anything:   Proceed with unit schedule Follow the [facility] Inpatient Flowsheet.

## 2018-06-29 NOTE — BHH Group Notes (Signed)
LCSW Group Therapy Note  06/29/2018 11:40 AM  Type of Therapy/Topic: Group Therapy: Feelings about Diagnosis  Participation Level: Minimal   Description of Group:  This group will allow patients to explore their thoughts and feelings about diagnoses they have received. Patients will be guided to explore their level of understanding and acceptance of these diagnoses. Facilitator will encourage patients to process their thoughts and feelings about the reactions of others to their diagnosis and will guide patients in identifying ways to discuss their diagnosis with significant others in their lives. This group will be process-oriented, with patients participating in exploration of their own experiences, giving and receiving support, and processing challenge from other group members.  Therapeutic Goals: 1. Patient will demonstrate understanding of diagnosis as evidenced by identifying two or more symptoms of the disorder 2. Patient will be able to express two feelings regarding the diagnosis 3. Patient will demonstrate their ability to communicate their needs through discussion and/or role play  Summary of Patient Progress: Patient remained present throughout the entire group and participated in the discussion. She spoke of how she is treated differently due to having a prior mental health diagnosis and said that her husband is the "crazy one," not her.   Therapeutic Modalities:  Cognitive Behavioral Therapy Brief Therapy Feelings Identification   Enid Cutter, MSW, [facility] Clinical Social Worker

## 2018-06-29 NOTE — Progress Notes (Signed)
[facility] NOVEL CORONAVIRUS (COVID-19) DAILY CHECK-OFF SYMPTOMS - answer yes or no to each - every day NO YES  Have you had a fever in the past 24 hours?  . Fever (Temp > 37.80C / 100F) X   Have you had any of these symptoms in the past 24 hours? . New Cough .  Sore Throat  .  Shortness of Breath .  Difficulty Breathing .  Unexplained Body Aches   X   Have you had any one of these symptoms in the past 24 hours not related to allergies?   . Runny Nose .  Nasal Congestion .  Sneezing   X   If you have had runny nose, nasal congestion, sneezing in the past 24 hours, has it worsened?  X   EXPOSURES - check yes or no X   Have you traveled outside the state in the past 14 days?  X   Have you been in contact with someone with a confirmed diagnosis of COVID-19 or PUI in the past 14 days without wearing appropriate PPE?  X   Have you been living in the same home as a person with confirmed diagnosis of COVID-19 or a PUI (household contact)?    X   Have you been diagnosed with COVID-19?    X              What to do next: Answered NO to all: Answered YES to anything:   Proceed with unit schedule Follow the [facility] Inpatient Flowsheet.   

## 2018-06-29 NOTE — [facility] Suicide Risk Assessment (Signed)
[facility] Admission Suicide Risk Assessment   Nursing information obtained from:  Patient Demographic factors:  Low socioeconomic status Current Mental Status:  NA Loss Factors:  Financial problems / change in socioeconomic status Historical Factors:  NA Risk Reduction Factors:  NA  Total Time spent with patient: 45 minutes Principal Problem: Amphetamine induced psychosis further prompting a more protracted psychotic disorder Diagnosis:  Active Problems:   Amphetamine and psychostimulant-induced psychosis with delusions (HCC)  Subjective Data: Amphetamine induced psychosis see HPI  Continued Clinical Symptoms:  Alcohol Use Disorder Identification Test Final Score (AUDIT): 0 The "Alcohol Use Disorders Identification Test", Guidelines for Use in Primary Care, Second Edition.  World Science writer [facility]). Score between 0-7:  no or low risk or alcohol related problems. Score between 8-15:  moderate risk of alcohol related problems. Score between 16-19:  high risk of alcohol related problems. Score 20 or above:  warrants further diagnostic evaluation for alcohol dependence and treatment.   CLINICAL FACTORS:   Alcohol/Substance Abuse/Dependencies   COGNITIVE FEATURES THAT CONTRIBUTE TO RISK:  Loss of executive function    SUICIDE RISK:   Minimal: No identifiable suicidal ideation.  Patients presenting with no risk factors but with morbid ruminations; may be classified as minimal risk based on the severity of the depressive symptoms  PLAN OF CARE: Denies current suicidal thoughts plans or intent see HPI  I certify that inpatient services furnished can reasonably be expected to improve the patient's condition.   Malvin Johns, MD 06/29/2018, 8:25 AM

## 2018-06-29 NOTE — Progress Notes (Signed)
CSW attempted to meet with patient to complete assessment, patient sleeping. CSW will follow up at a later time.  Enid Cutter, LCSW-A Clinical Social Worker
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
## 2018-06-30 MED ORDER — TEMAZEPAM 15 MG PO CAPS
45.0000 mg | ORAL_CAPSULE | Freq: Every day | ORAL | Status: DC
  Administered 2018-06-30: 45 mg via ORAL

## 2018-06-30 MED ORDER — ARIPIPRAZOLE 2 MG PO TABS
2.0000 mg | ORAL_TABLET | Freq: Once | ORAL | Status: AC
  Administered 2018-06-30: 10:00:00 2 mg via ORAL
  Filled 2018-06-30 (×2): qty 1

## 2018-06-30 MED ORDER — BENZTROPINE MESYLATE 0.5 MG PO TABS
0.5000 mg | ORAL_TABLET | Freq: Every day | ORAL | Status: DC
  Administered 2018-07-01: 0.5 mg via ORAL
  Filled 2018-06-30 (×3): qty 1

## 2018-06-30 NOTE — [facility] Group Notes (Signed)
  [facility] LCSW Group Therapy Note  Date/Time: 06/30/18, 1315  Type of Therapy/Topic:  Group Therapy:  Emotion Regulation  Participation Level:  Minimal   Mood:sleepy  Description of Group:    The purpose of this group is to assist patients in learning to regulate negative emotions and experience positive emotions. Patients will be guided to discuss ways in which they have been vulnerable to their negative emotions. These vulnerabilities will be juxtaposed with experiences of positive emotions or situations, and patients challenged to use positive emotions to combat negative ones. Special emphasis will be placed on coping with negative emotions in conflict situations, and patients will process healthy conflict resolution skills.  Therapeutic Goals: 1. Patient will identify two positive emotions or experiences to reflect on in order to balance out negative emotions:  2. Patient will label two or more emotions that they find the most difficult to experience:  3. Patient will be able to demonstrate positive conflict resolution skills through discussion or role plays:   Summary of Patient Progress:Pt again very sleepy in group.  CSW attempted to wake her several times and she would wake up long enough to answer a question but then would fall back asleep.  Pt pleasant, says she is "just tired."  Identified anger as emotion difficult for her to experience but not at all engaged in group discussion.        Therapeutic Modalities:   Cognitive Behavioral Therapy Feelings Identification Dialectical Behavioral Therapy  Daleen Squibb, LCSW

## 2018-06-30 NOTE — Progress Notes (Signed)
[facility]  06/30/2018 9:43 AM Tamara Nguyen  MRN:  [id number] Subjective:    Patient seems to be much more organized today stating she will live with her husband and this point she has not made meaningful discharge plans otherwise.  But she denies hallucinations she is less paranoid she does not have thoughts of harming self or others no EPS or TD, will be given a test dose of aripiprazole in anticipation of long-acting injectable. Principal Problem: I believe we have a chronically undertreated/untreated bipolar type is O affective condition, complicated by amphetamine induced exacerbations Diagnosis: Active Problems:   Amphetamine and psychostimulant-induced psychosis with delusions (HCC)  Total Time spent with patient: 20 minutes  Past Medical History:  Past Medical History:  Diagnosis Date  . Major depressive disorder     Past Surgical History:  Procedure Laterality Date  . PANCREAS SURGERY     Family History: History reviewed. No pertinent family history.  Social History:  Social History   Substance and Sexual Activity  Alcohol Use No     Social History   Substance and Sexual Activity  Drug Use Not on file    Social History   Socioeconomic History  . Marital status: Single    Spouse name: Not on file  . Number of children: Not on file  . Years of education: Not on file  . Highest education level: Not on file  Occupational History  . Not on file  Social Needs  . Financial resource strain: Not on file  . Food insecurity:    Worry: Not on file    Inability: Not on file  . Transportation needs:    Medical: Not on file    Non-medical: Not on file  Tobacco Use  . Smoking status: Current Some Day Smoker  . Smokeless tobacco: Never Used  Substance and Sexual Activity  . Alcohol use: No  . Drug use: Not on file  . Sexual activity: Not on file  Lifestyle  . Physical activity:    Days per week: Not on file    Minutes per session: Not on file  .  Stress: Not on file  Relationships  . Social connections:    Talks on phone: Not on file    Gets together: Not on file    Attends religious service: Not on file    Active member of club or organization: Not on file    Attends meetings of clubs or organizations: Not on file    Relationship status: Not on file  Other Topics Concern  . Not on file  Social History Narrative  . Not on file   Additional Social History:                         Sleep: Fair  Appetite:  Fair  Current Medications: Current Facility-Administered Medications  Medication Dose Route Frequency Provider Last Rate Last Dose  . acetaminophen (TYLENOL) tablet 650 mg  650 mg Oral Q6H PRN Charm RingsLord, Jamison Y, NP      . alum & mag hydroxide-simeth (MAALOX/MYLANTA) 200-200-20 MG/5ML suspension 30 mL  30 mL Oral Q4H PRN Charm RingsLord, Jamison Y, NP      . ARIPiprazole (ABILIFY) tablet 2 mg  2 mg Oral Once Malvin JohnsFarah, [place] Stell, MD      . benztropine (COGENTIN) tablet 1 mg  1 mg Oral Daily Charm RingsLord, Jamison Y, NP   1 mg at 06/30/18 0750  . busPIRone (BUSPAR) tablet 15 mg  15 mg Oral TID Charm Rings, NP   15 mg at 06/30/18 0750  . gabapentin (NEURONTIN) capsule 300 mg  300 mg Oral TID Charm Rings, NP   300 mg at 06/30/18 0749  . haloperidol (HALDOL) tablet 5 mg  5 mg Oral BID Charm Rings, NP   5 mg at 06/30/18 0750  . magnesium hydroxide (MILK OF MAGNESIA) suspension 30 mL  30 mL Oral Daily PRN Charm Rings, NP      . Pancrelipase (Lip-Prot-Amyl) 3000-9500 units CPEP 1 capsule  1 capsule Oral TID WC Lord, Herminio Heads, NP      . pantoprazole (PROTONIX) EC tablet 40 mg  40 mg Oral Daily Charm Rings, NP   40 mg at 06/30/18 0750  . temazepam (RESTORIL) capsule 30 mg  30 mg Oral QHS Malvin Johns, MD      . traZODone (DESYREL) tablet 50 mg  50 mg Oral QHS PRN Nira Conn A, NP   50 mg at 06/30/18 0118    Lab Results: No results found for this or any previous visit (from the past 48 hour(s)).  Blood Alcohol level:  Lab  Results  Component Value Date   ETH <10 06/27/2018   ETH <5 11/28/2016    Metabolic Disorder Labs: No results found for: HGBA1C, MPG No results found for: PROLACTIN No results found for: CHOL, TRIG, HDL, CHOLHDL, VLDL, LDLCALC  Physical Findings: AIMS:  , ,  ,  ,    CIWA:    COWS:     Musculoskeletal: Strength & Muscle Tone: within normal limits Gait & Station: normal Patient leans: N/A  Psychiatric Specialty Exam: Physical Exam  ROS  Blood pressure 111/69, pulse 78, temperature 98.2 F (36.8 C), resp. rate 18, height 5\' 2"  (1.575 m), weight 59 kg, last menstrual period 06/09/2018, SpO2 99 %.Body mass index is 23.78 kg/m.  General Appearance: Casual  Eye Contact:  Good  Speech:  Clear and Coherent  Volume:  Normal  Mood:  hypomaia better  Affect:  Congruent  Thought Process:  Linear  Orientation:  Full (Time, Place, and Person)  Thought Content:  Tangential  Suicidal Thoughts:  none  Homicidal Thoughts:  No  Memory:  Negative Immediate;   Good  Judgement:  Good  Insight:  Fair  Psychomotor Activity:  Normal  Concentration:  Concentration: Fair  Recall:  [employer] of Knowledge:  Fair  Language:  Fair  Akathisia:  Negative  Handed:  Right  AIMS (if indicated):     Assets:  Resilience  ADL's:  Intact  Cognition:  WNL  Sleep:  Number of Hours: 3.25     Treatment Plan Summary: Daily contact with patient to assess and evaluate symptoms and progress in treatment, Medication management and Plan Not sleeping so we will make adjustments continue cognitive therapies  Malvin Johns, MD 06/30/2018, 9:43 AM

## 2018-06-30 NOTE — Progress Notes (Signed)
   06/30/18 0500  Sleep  Number of Hours 3.25

## 2018-06-30 NOTE — Progress Notes (Signed)
Pt currently irritable and upset about a Pt on hall. Pt is yelling in her room. Pt was told she needed to lower her voice for that she is being disruptive to others on hall. Pt close her door and told staff not to bother her.

## 2018-06-30 NOTE — Progress Notes (Signed)
Nursing Progress Note: 7p-7a D: Pt currently presents with a pleasant/appropriate affect and behavior. Interacting appropriately with the milieu. Pt reports good sleep during the previous night with current medication regimen.   A: Pt provided with medications per providers orders. Pt's labs and vitals were monitored throughout the night. Pt supported emotionally and encouraged to express concerns and questions. Pt educated on medications.  R: Pt's safety ensured with 15 minute and environmental checks. Pt currently denies SI, HI, and AVH. Pt verbally contracts to seek staff if SI,HI, or AVH occurs and to consult with staff before acting on any harmful thoughts. Will continue to monitor.

## 2018-06-30 NOTE — Tx Team (Signed)
Interdisciplinary Treatment and Diagnostic Plan Update  06/30/2018 Time of Session: 0918 Tamara Nguyen MRN: [id number]  Principal Diagnosis: <principal problem not specified>  Secondary Diagnoses: Active Problems:   Amphetamine and psychostimulant-induced psychosis with delusions (HCC)   Current Medications:  Current Facility-Administered Medications  Medication Dose Route Frequency Provider Last Rate Last Dose  . acetaminophen (TYLENOL) tablet 650 mg  650 mg Oral Q6H PRN Charm Rings, NP      . alum & mag hydroxide-simeth (MAALOX/MYLANTA) 200-200-20 MG/5ML suspension 30 mL  30 mL Oral Q4H PRN Charm Rings, NP      . Melene Muller ON 07/01/2018] benztropine (COGENTIN) tablet 0.5 mg  0.5 mg Oral Daily Malvin Johns, MD      . busPIRone (BUSPAR) tablet 15 mg  15 mg Oral TID Charm Rings, NP   15 mg at 06/30/18 0750  . gabapentin (NEURONTIN) capsule 300 mg  300 mg Oral TID Charm Rings, NP   300 mg at 06/30/18 0749  . haloperidol (HALDOL) tablet 5 mg  5 mg Oral BID Charm Rings, NP   5 mg at 06/30/18 0750  . magnesium hydroxide (MILK OF MAGNESIA) suspension 30 mL  30 mL Oral Daily PRN Charm Rings, NP      . Pancrelipase (Lip-Prot-Amyl) 3000-9500 units CPEP 1 capsule  1 capsule Oral TID WC Lord, Herminio Heads, NP      . pantoprazole (PROTONIX) EC tablet 40 mg  40 mg Oral Daily Charm Rings, NP   40 mg at 06/30/18 0750  . temazepam (RESTORIL) capsule 45 mg  45 mg Oral QHS Malvin Johns, MD      . traZODone (DESYREL) tablet 50 mg  50 mg Oral QHS PRN Jackelyn Poling, NP   50 mg at 06/30/18 0118   PTA Medications: Medications Prior to Admission  Medication Sig Dispense Refill Last Dose  . busPIRone (BUSPAR) 15 MG tablet Take 15 mg by mouth 3 (three) times daily.   Unknown at Unknown  . hydrOXYzine (ATARAX/VISTARIL) 50 MG tablet Take 1 tablet by mouth every 6 (six) hours as needed for anxiety or sleep.   Unknown at Unknown  . omeprazole (PRILOSEC) 20 MG capsule Take 20 mg by mouth  daily.   Unknown at Unknown  . Pancrelipase, Lip-Prot-Amyl, 3000-9500 units CPEP Take 1 capsule by mouth 3 (three) times daily with meals.    Unknown at Unknown  . prochlorperazine (COMPAZINE) 10 MG tablet Take 10 mg by mouth daily as needed for nausea or vomiting.   Completed Course at Unknown time  . risperiDONE (RISPERDAL) 0.25 MG tablet Take 0.25 mg by mouth 2 (two) times daily.       Patient Stressors: Paediatric nurse issue Marital or family conflict  Patient Strengths: Capable of independent living General fund of knowledge Motivation for treatment/growth Physical Health  Treatment Modalities: Medication Management, Group therapy, Case management,  1 to 1 session with clinician, Psychoeducation, Recreational therapy.   Physician Treatment Plan for Primary Diagnosis: <principal problem not specified> Long Term Goal(s): Improvement in symptoms so as ready for discharge Improvement in symptoms so as ready for discharge   Short Term Goals: Ability to verbalize feelings will improve Compliance with prescribed medications will improve  Medication Management: Evaluate patient's response, side effects, and tolerance of medication regimen.  Therapeutic Interventions: 1 to 1 sessions, Unit Group sessions and Medication administration.  Evaluation of Outcomes: Progressing  Physician Treatment Plan for Secondary Diagnosis: Active Problems:   Amphetamine and psychostimulant-induced  psychosis with delusions (HCC)  Long Term Goal(s): Improvement in symptoms so as ready for discharge Improvement in symptoms so as ready for discharge   Short Term Goals: Ability to verbalize feelings will improve Compliance with prescribed medications will improve     Medication Management: Evaluate patient's response, side effects, and tolerance of medication regimen.  Therapeutic Interventions: 1 to 1 sessions, Unit Group sessions and Medication administration.  Evaluation of Outcomes:  Progressing   RN Treatment Plan for Primary Diagnosis: <principal problem not specified> Long Term Goal(s): Knowledge of disease and therapeutic regimen to maintain health will improve  Short Term Goals: Ability to identify and develop effective coping behaviors will improve and Compliance with prescribed medications will improve  Medication Management: RN will administer medications as ordered by provider, will assess and evaluate patient's response and provide education to patient for prescribed medication. RN will report any adverse and/or side effects to prescribing provider.  Therapeutic Interventions: 1 on 1 counseling sessions, Psychoeducation, Medication administration, Evaluate responses to treatment, Monitor vital signs and CBGs as ordered, Perform/monitor CIWA, COWS, AIMS and Fall Risk screenings as ordered, Perform wound care treatments as ordered.  Evaluation of Outcomes: Progressing   LCSW Treatment Plan for Primary Diagnosis: <principal problem not specified> Long Term Goal(s): Safe transition to appropriate next level of care at discharge, Engage patient in therapeutic group addressing interpersonal concerns.  Short Term Goals: Engage patient in aftercare planning with referrals and resources, Increase social support and Increase skills for wellness and recovery  Therapeutic Interventions: Assess for all discharge needs, 1 to 1 time with Social worker, Explore available resources and support systems, Assess for adequacy in community support network, Educate family and significant other(s) on suicide prevention, Complete Psychosocial Assessment, Interpersonal group therapy.  Evaluation of Outcomes: Progressing   Progress in Treatment: Attending groups: Yes. Participating in groups: Yes. Taking medication as prescribed: Yes. Toleration medication: Yes. Family/Significant other contact made: No, will contact:  father Patient understands diagnosis: No. Discussing patient  identified problems/goals with staff: Yes. Medical problems stabilized or resolved: Yes. Denies suicidal/homicidal ideation: Yes. Issues/concerns per patient self-inventory: No. Other: none  New problem(s) identified: No, Describe:  none  New Short Term/Long Term Goal(s):  Patient Goals:  "smooth things over with my family"  Discharge Plan or Barriers:   Reason for Continuation of Hospitalization: Delusions  Medication stabilization  Estimated Length of Stay: 2-4 days  Attendees: Patient: Tamara Nguyen 06/30/2018   Physician: Dr. Jeannine KittenFarah, MD 06/30/2018   Nursing: Norma FredricksonMichael Scearce, RN 06/30/2018   RN Care Manager: 06/30/2018   Social Worker: Daleen SquibbGreg Jarquez Mestre, LCSW 06/30/2018   Recreational Therapist:  06/30/2018   Other:  06/30/2018   Other:  06/30/2018   Other: 06/30/2018        Scribe for Treatment Team: Lorri FrederickWierda, Sarayu Prevost Jon, LCSW 06/30/2018 11:37 AM

## 2018-06-30 NOTE — Progress Notes (Signed)
[facility] NOVEL CORONAVIRUS (COVID-19) DAILY CHECK-OFF SYMPTOMS - answer yes or no to each - every day NO YES  Have you had a fever in the past 24 hours?  . Fever (Temp > 37.80C / 100F) X   Have you had any of these symptoms in the past 24 hours? . New Cough .  Sore Throat  .  Shortness of Breath .  Difficulty Breathing .  Unexplained Body Aches   X   Have you had any one of these symptoms in the past 24 hours not related to allergies?   . Runny Nose .  Nasal Congestion .  Sneezing   X   If you have had runny nose, nasal congestion, sneezing in the past 24 hours, has it worsened?  X   EXPOSURES - check yes or no X   Have you traveled outside the state in the past 14 days?  X   Have you been in contact with someone with a confirmed diagnosis of COVID-19 or PUI in the past 14 days without wearing appropriate PPE?  X   Have you been living in the same home as a person with confirmed diagnosis of COVID-19 or a PUI (household contact)?    X   Have you been diagnosed with COVID-19?    X              What to do next: Answered NO to all: Answered YES to anything:   Proceed with unit schedule Follow the [facility] Inpatient Flowsheet.   

## 2018-06-30 NOTE — [facility] Suicide Risk Assessment (Signed)
[facility] INPATIENT:  Family/Significant Other Suicide Prevention Education  Suicide Prevention Education:  Education Completed;Jimmy Mitzi Hansen, father, [phone],   has been identified by the patient as the family member/significant other with whom the patient will be residing, and identified as the person(s) who will aid the patient in the event of a mental health crisis (suicidal ideations/suicide attempt).  With written consent from the patient, the family member/significant other has been provided the following suicide prevention education, prior to the and/or following the discharge of the patient.  The suicide prevention education provided includes the following:  Suicide risk factors  Suicide prevention and interventions  National Suicide Hotline telephone number  [facility] assessment telephone number  [facility] Emergency Assistance 911  [facility] and/or Residential Mobile Crisis Unit telephone number  Request made of family/significant other to:  Remove weapons (e.g., guns, rifles, knives), all items previously/currently identified as safety concern.  Father does not know if pt has access to guns.   Remove drugs/medications (over-the-counter, prescriptions, illicit drugs), all items previously/currently identified as a safety concern.  The family member/significant other verbalizes understanding of the suicide prevention education information provided.  The family member/significant other agrees to remove the items of safety concern listed above.   Father reports that he has not talked to pt in 6 months, recommended CSW talk to her mother or husband.  Pt called him 3 am last Saturday talking out of her head.  He knows that pt does not stay on her medication.  Pt has had mental health problems ever since she had surgery quite a few years ago.   Lorri Frederick, LCSW 06/30/2018, 3:07 PM

## 2018-06-30 NOTE — Plan of Care (Signed)
Progress note  D: pt found in bed; compliant with medication administration. Pt states she slept well. Pt rates her depression/hopelessness/anxiety a 4/1/0 out of 10 respectively. Pt denies any physical pain or symptoms, rating her pain a 0/10. Pt states her goal for today is to go home, and she will achieve this by talking to her family. Pt denies si/hi/ah/vh and verbally agrees to approach staff if these become apparent or before harming herself/others while at [facility]. A: pt provided support and encouragement. Pt given medication per protocol and standing orders. Q33m safety checks implemented and continued.  R: pt safe on the unit. Will continue to monitor.   Pt progressing in the following metrics  Problem: Education: Goal: Knowledge of [place] [facility] Education information/materials will improve Outcome: Progressing Goal: Mental status will improve Outcome: Progressing Goal: Verbalization of understanding the information provided will improve Outcome: Progressing   Problem: Activity: Goal: Interest or engagement in leisure activities will improve Outcome: Progressing

## 2018-06-30 NOTE — [facility] Suicide Risk Assessment (Signed)
[facility] INPATIENT:  Family/Significant Other Suicide Prevention Education  Suicide Prevention Education:  Contact Attempts: Tamara Nguyen, father, [phone], has been identified by the patient as the family member/significant other with whom the patient will be residing, and identified as the person(s) who will aid the patient in the event of a mental health crisis.  With written consent from the patient, two attempts were made to provide suicide prevention education, prior to and/or following the patient's discharge.  We were unsuccessful in providing suicide prevention education.  A suicide education pamphlet was given to the patient to share with family/significant other.  Date and time of first attempt:06/30/18, 1501 Date and time of second attempt:  Lorri Frederick, LCSW 06/30/2018, 3:01 PM

## 2018-06-30 NOTE — [facility] Group Notes (Signed)
[facility] Group Notes:  (Nursing/MHT/Case Management/Adjunct)  Date:  06/30/2018  Time:  4:00 PM  Type of Therapy:  Nurse Education  Participation Level:  Active  Participation Quality:  Appropriate and Attentive  Affect:  Appropriate  Cognitive:  Alert and Appropriate  Insight:  Appropriate  Engagement in Group:  Engaged and Improving  Modes of Intervention:  Discussion and Education  Summary of Progress/Problems: pt's discussed discharge planning and how to utilize resources when needed.  Suszanne Conners Toyia Jelinek 06/30/2018, 4:41 PM

## 2018-07-01 MED ORDER — TRAZODONE HCL 150 MG PO TABS: 150.0000 mg | ORAL_TABLET | Freq: Every evening | ORAL | 2 refills | Status: DC | PRN

## 2018-07-01 MED ORDER — BENZTROPINE MESYLATE 0.5 MG PO TABS: 0.5000 mg | ORAL_TABLET | Freq: Every day | ORAL | 2 refills | Status: DC

## 2018-07-01 MED ORDER — ARIPIPRAZOLE ER 400 MG IM SRER: 400.0000 mg | INTRAMUSCULAR | 0 refills | Status: DC

## 2018-07-01 MED ORDER — GABAPENTIN 300 MG PO CAPS: 300.0000 mg | ORAL_CAPSULE | Freq: Three times a day (TID) | ORAL | 2 refills | Status: DC

## 2018-07-01 MED ORDER — HALOPERIDOL 10 MG PO TABS: 10.0000 mg | ORAL_TABLET | Freq: Every day | ORAL | 2 refills | Status: DC

## 2018-07-01 MED ORDER — ARIPIPRAZOLE ER 400 MG IM SRER
400.0000 mg | Freq: Once | INTRAMUSCULAR | Status: AC
  Administered 2018-07-01: 400 mg via INTRAMUSCULAR

## 2018-07-01 NOTE — Progress Notes (Signed)
Pt discharged to lobby. Pt was stable and appreciative at that time. All papers and prescriptions were given and valuables returned. Verbal understanding expressed. Denies SI/HI and A/VH. Pt given opportunity to express concerns and ask questions.  

## 2018-07-01 NOTE — [facility] Suicide Risk Assessment (Signed)
[facility] Discharge Suicide Risk Assessment   Principal Problem: Amphetamine induced psychosis Discharge Diagnoses: Active Problems:   Amphetamine and psychostimulant-induced psychosis with delusions (HCC)   Total Time spent with patient: 45 minutes  Seems to have recalibrated what would be expected at a baseline status alert oriented without mania or thoughts of self-harm or acute psychosis Mental Status Per Nursing Assessment::   On Admission:  NA  Demographic Factors:  Caucasian  Loss Factors: Loss of significant relationship  Historical Factors: Impulsivity  Risk Reduction Factors:   Sense of responsibility to family and Religious beliefs about death  Continued Clinical Symptoms:  Alcohol/Substance Abuse/Dependencies  Cognitive Features That Contribute To Risk:  Closed-mindedness    Suicide Risk:  Minimal: No identifiable suicidal ideation.  Patients presenting with no risk factors but with morbid ruminations; may be classified as minimal risk based on the severity of the depressive symptoms  Follow-up Information    [facility] Follow up on 07/07/2018.   Why:  Medication management appointment with Marijean Niemann is Wednesday, 4/22 at 1:00p. Therapy appointment with Boneta Lucks is Wednesday, 4/22 at 3:00p.  The appointments will be held over the telephone. The providers will contact the patient. Contact information: [address] [place] [address] Phone: [phone] Fax: [phone]          Plan Of Care/Follow-up recommendations:  Activity:  full  Crawford Tamura, MD 07/01/2018, 9:59 AM

## 2018-07-01 NOTE — Progress Notes (Signed)
  [facility] Adult Case Management Discharge Plan :  Will you be returning to the same living situation after discharge:  Yes,  with husband At discharge, do you have transportation home?: Yes,  husband Do you have the ability to pay for your medications: Yes,  medicaid  Release of information consent forms completed and in the chart;  Patient's signature needed at discharge.  Patient to Follow up at: Follow-up Information    [facility] Follow up on 07/07/2018.   Why:  Medication management appointment with Marijean Niemann is Wednesday, 4/22 at 1:00p. Therapy appointment with Boneta Lucks is Wednesday, 4/22 at 3:00p.  The appointments will be held over the telephone. The providers will contact the patient. Contact information: [address], [address] [place] [address] Phone: [phone] Fax: [phone]          Next level of care provider has access to [facility] Link:no  Safety Planning and Suicide Prevention discussed: Yes,  with father  Have you used any form of tobacco in the last 30 days? (Cigarettes, Smokeless Tobacco, Cigars, and/or Pipes): Yes  Has patient been referred to the Quitline?: Patient refused referral  Patient has been referred for addiction treatment: Pt. refused referral  Lorri Frederick, LCSW 07/01/2018, 10:08 AM
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
## 2018-07-06 MED ORDER — OMEPRAZOLE 20 MG CAPSULE,DELAYED RELEASE
ORAL_CAPSULE | Freq: Every day | ORAL | 5 refills | 0 days | Status: CP
Start: 2018-07-06 — End: 2019-07-06

## 2018-07-08 ENCOUNTER — Ambulatory Visit: Admit: 2018-07-08 | Discharge: 2018-07-08 | Payer: MEDICAID | Attending: Clinical | Primary: Clinical

## 2018-07-08 ENCOUNTER — Institutional Professional Consult (permissible substitution)
Admit: 2018-07-08 | Discharge: 2018-07-08 | Payer: MEDICAID | Attending: Psychiatric/Mental Health | Primary: Psychiatric/Mental Health

## 2018-07-08 DIAGNOSIS — F1590 Other stimulant use, unspecified, uncomplicated: Secondary | ICD-10-CM

## 2018-07-08 DIAGNOSIS — F310 Bipolar disorder, current episode hypomanic: Principal | ICD-10-CM

## 2018-07-21 MED ORDER — ARIPIPRAZOLE ER 400 MG INTRAMUSCULAR SUSPENSION,EXTENDED RELEASE
INTRAMUSCULAR | 3 refills | 0 days | Status: CP
Start: 2018-07-21 — End: ?

## 2018-08-12 MED ORDER — LIPASE-PROTEASE-AMYLASE 3,000-9,500-15,000 UNIT CAPSULE,DELAYED RELEAS
ORAL_CAPSULE | Freq: Three times a day (TID) | ORAL | 10 refills | 0.00000 days | Status: CP
Start: 2018-08-12 — End: 2019-08-12

## 2018-08-17 MED ORDER — GABAPENTIN 300 MG CAPSULE
ORAL_CAPSULE | Freq: Three times a day (TID) | ORAL | 0 refills | 0 days | Status: CP
Start: 2018-08-17 — End: 2019-08-17

## 2018-08-17 MED ORDER — BUSPIRONE 15 MG TABLET
ORAL_TABLET | Freq: Three times a day (TID) | ORAL | 0 refills | 0.00000 days | Status: CP
Start: 2018-08-17 — End: 2018-09-16

## 2018-12-16 ENCOUNTER — Ambulatory Visit
Admit: 2018-12-16 | Discharge: 2018-12-17 | Payer: MEDICAID | Attending: Psychiatric/Mental Health | Primary: Psychiatric/Mental Health

## 2018-12-16 DIAGNOSIS — F3181 Bipolar II disorder: Secondary | ICD-10-CM

## 2018-12-16 DIAGNOSIS — F1510 Other stimulant abuse, uncomplicated: Secondary | ICD-10-CM

## 2018-12-16 DIAGNOSIS — F310 Bipolar disorder, current episode hypomanic: Secondary | ICD-10-CM

## 2018-12-16 DIAGNOSIS — Z23 Encounter for immunization: Secondary | ICD-10-CM

## 2018-12-16 MED ORDER — ARIPIPRAZOLE ER 400 MG INTRAMUSCULAR SUSPENSION,EXTENDED RELEASE
INTRAMUSCULAR | 3 refills | 28 days | Status: CP
Start: 2018-12-16 — End: ?
  Filled 2018-12-28: qty 1, 28d supply, fill #0

## 2018-12-16 MED ORDER — HALOPERIDOL 5 MG TABLET
ORAL_TABLET | Freq: Every day | ORAL | 0 refills | 90 days | Status: CP
Start: 2018-12-16 — End: 2019-03-16

## 2018-12-16 MED ORDER — BUSPIRONE 15 MG TABLET
ORAL_TABLET | Freq: Three times a day (TID) | ORAL | 0 refills | 90.00000 days | Status: CP
Start: 2018-12-16 — End: 2019-03-16
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
## 2018-12-16 NOTE — Unmapped (Signed)
Collected 5 ML of urine specimen at today's office visit.

## 2018-12-17 DIAGNOSIS — K860 Alcohol-induced chronic pancreatitis: Secondary | ICD-10-CM

## 2018-12-20 MED ORDER — LIPASE-PROTEASE-AMYLASE 3,000-9,500-15,000 UNIT CAPSULE,DELAYED RELEAS
ORAL_CAPSULE | Freq: Three times a day (TID) | ORAL | 10 refills | 30 days | Status: CP
Start: 2018-12-20 — End: 2019-12-20

## 2018-12-22 NOTE — Unmapped (Signed)
injection scheduled for 12/30/18    [facility]   Patient Onboarding/Medication Counseling    The patient declined counseling on medication administration, missed dose instructions, goals of therapy, side effects and monitoring parameters, warnings and precautions, drug/food interactions and storage, handling precautions, and disposal because Unable to reach patient.  Her injection has been scheduled for in-clinic administration Thurs 12/30/18.  Marland Kitchen The information in the declined sections below are for informational purposes only and was not discussed with patient.     Ms.Jennifer Townsend is a 38 y.o. female with Bipolar I disorder who I am counseling today on continuation of therapy.  I am speaking to clinic.    Verified patient's date of birth / HIPAA.    Specialty medication(s) to be sent: General Specialty: Yevonne Pax      Non-specialty medications/supplies to be sent: none      Medications not needed at this time: none         Abilify Maintena (aripiprazole) injection    Medication & Administration     Dosage: Inject 2 mL (400 mg total) into the muscle every twenty-eight (28) days.    Administration: Administered IM in the clinic    Adherence/Missed dose instructions: Call your provider.    Goals of Therapy     Used in the treatment of bipolar I disorder in adults    Side Effects & Monitoring Parameters   Common side effects:   ? Restlessness; rocking back and forth; uneasy feeling; unable to sit still  ? Headache  ? Weight gain  ? Nausea or constipation  ? Irritation at injection site    The following side effects should be reported to the provider:  ? Allergic reaction  ? High blood sugars ( confusion, increased sleepiness, increased thirst, increased hunger, increased urination; fruity breath)  ? Trouble controlling body movements  ? Strong urges that are hard to control (eating, gambling, sex, or spending money)  ? Extreme dizziness or passing out; change in balance  ? Fast heartbeat  ? Seizures  ? NMS (neuroleptic malignant syndrome): fever muscle cramps, stiffness, dizziness, very bad headache, confusion, change in thinking, abnormal heartbeat, increased sweating  ? Tardive dyskinesia - severe muscle problems (trouble controlling body movements, problems with tongue, face, mouth, or jaw)      Contraindications, Warnings, & Precautions     ? [US Boxed Warning]: Elderly patients with dementia-related psychosis treated with antipsychotics are at an increased risk of death compared to placebo        ? May cause change in lipid profile albeit minimal to low  ? Associated with esophageal dysmotility and aspiration  ? May cause extrapyramidal symptoms like pseudoparkinsonism, akathisia, tardive dyskinesia  ? May increase risk of falls  ? May cause hyperglycemia  ? Issues with temperature regulation    Drug/Food Interactions     ? Medication list reviewed in Epic. The patient was instructed to inform the care team before taking any new medications or supplements.  Abilify Maintena may enhance the adverse/toxic effect of other CNS Depressants like gabapentin, haloperidol, and promethazine.--Ms. Helling has been on medication for few months now    Storage, Handling Precautions, & Disposal   ? Store this medication at room temperature.  ? Keep away from children and pets    Current Medications (including OTC/herbals), Comorbidities and Allergies     Current Outpatient Medications   Medication Sig Dispense Refill   ??? ARIPiprazole (ABILIFY MAINTENA) 400 mg SERR injection Inject 2 mL (400  mg total) into the muscle every twenty-eight (28) days. 1 each 3   ??? busPIRone (BUSPAR) 15 MG tablet Take 1 tablet (15 mg total) by mouth Three (3) times a day. 270 tablet 0   ??? calcium carbonate-simethicone (MAALOX ADVANCED) 1,000-60 mg Chew Chew 1 tablet Three (3) times a day as needed (indigestion). (Patient not taking: Reported on 12/16/2018) 30 tablet 3   ??? dextromethorphan-guaifenesin (ROBITUSSIN COUGH-CHEST CONG DM) 10-200 mg cap Take 1 capsule by mouth daily as needed (cough). (Patient not taking: Reported on 12/16/2018) 30 each 0   ??? famotidine (PEPCID) 20 MG tablet TAKE 2 TABLETS (40 MG) BY MOUTH TWICE DAILY AS NEEDED FOR HEARTBURN 60 tablet 2   ??? fluticasone (FLONASE) 50 mcg/actuation nasal spray 1 spray by Each Nare route daily. 16 g 2   ??? folic acid (FOLVITE) 1 MG tablet Take 1 tablet (1 mg total) by mouth daily. (Patient not taking: Reported on 12/16/2018) 30 tablet 0   ??? gabapentin (NEURONTIN) 300 MG capsule Take 1 capsule (300 mg total) by mouth Three (3) times a day. (Patient not taking: Reported on 12/16/2018) 90 capsule 0   ??? haloperidoL (HALDOL) 5 MG tablet Take 1 tablet (5 mg total) by mouth daily. 90 tablet 0   ??? lactulose (CHRONULAC) 10 gram/15 mL (15 mL) Soln Take 15 mL (10 g total) by mouth daily as needed for up to 30 doses. (Patient not taking: Reported on 12/16/2018) 300 mL 0   ??? omeprazole (PRILOSEC) 20 MG capsule Take 1 capsule (20 mg total) by mouth daily. 30 capsule 5   ??? pancrelipase, Lip-Prot-Amyl, (CREON) 3,000-9,500- 15,000 unit CpDR capsule, delayed release Take 1 capsule (3,000 units of lipase total) by mouth Three (3) times a day with a meal. 90 capsule 10   ??? polyethylene glycol (MIRALAX) 17 gram packet Take 17 g by mouth two (2) times a day as needed. (Patient not taking: Reported on 12/16/2018) 60 packet 3   ??? promethazine (PHENERGAN) 12.5 MG tablet Take 1 tablet (12.5 mg total) by mouth every six (6) hours as needed for nausea. 30 tablet 3     Current Facility-Administered Medications   Medication Dose Route Frequency Provider Last Rate Last Dose   ??? ARIPiprazole ER (ABILIFY MAINTENA) injection 400 mg  400 mg Intramuscular Once Rosezetta Schlatter, PMHNP         Facility-Administered Medications Ordered in Other Visits   Medication Dose Route Frequency Provider Last Rate Last Dose   ??? bupivacaine (PF) (MARCAINE) 0.25 % (2.5 mg/mL) injection (PF)    PRN (once a day) Excell Seltzer, RN   3 mL at 06/20/15 1334   ??? bupivacaine (PF) (MARCAINE) 0.25 % (2.5 mg/mL) injection (PF)    Continuous PRN Excell Seltzer, RN 2 mL/hr at 06/20/15 1341 2 mL/hr at 06/20/15 1341   ??? lidocaine-EPINEPHrine (PF) 1.5 %-1:200,000 injection    PRN (once a day) Excell Seltzer, RN   3 mL at 06/20/15 1335       No Known Allergies    Patient Active Problem List   Diagnosis   ??? History of pancreatitis   ??? History of nephrolithiasis   ??? Gallstones   ??? Anemia due to vitamin B12 deficiency   ??? Nausea   ??? Acute pancreatitis   ??? PVT (portal vein thrombosis)   ??? Alcohol-induced chronic pancreatitis (CMS-HCC)   ??? History of partial pancreatectomy   ??? Overdose of medication   ??? Insomnia disorder   ??? Tobacco  abuse   ??? Chronic allergic rhinitis   ??? Gastroesophageal reflux disease without esophagitis   ??? Major depression   ??? Inattention   ??? Bipolar 2 disorder (CMS-HCC)       Reviewed and up to date in Epic.    Appropriateness of Therapy     Is medication and dose appropriate based on diagnosis? Yes    Baseline Quality of Life Assessment      How many days over the past month did your Bipolar I Disorder keep you from your normal activities? NA    Financial Information     Medication Assistance provided: Prior Authorization    Anticipated copay of $3 / 28 DAYS reviewed with patient. Verified delivery address.    Delivery Information     Scheduled delivery date: 12/28/18 before 12pm    Expected start date: 12/30/18    Medication will be delivered via [facility] [place] [facility] to the prescription address in [place].  This shipment will not require a signature.      Explained the services we provide at [facility] and that each month we would call to set up refills.  Stressed importance of returning phone calls so that we could ensure they receive their medications in time each month.  Informed patient that we should be setting up refills 7-10 days prior to when they will run out of medication.  A pharmacist will reach out to perform a clinical assessment periodically.  Informed patient that a welcome packet and a drug information handout will be sent.      Patient verbalized understanding of the above information as well as how to contact the pharmacy at [phone] option 4 with any questions/concerns.  The pharmacy is open Monday through Friday 8:30am-4:30pm.  A pharmacist is available 24/7 via pager to answer any clinical questions they may have.    Patient Specific Needs     ? Does the patient have any physical, cognitive, or cultural barriers? No    ? Patient prefers to have medications discussed with  call clinic to set up delivery     ? Is the patient able to read and understand education materials at a high school level or above? Yes    ? Patient's primary language is  English     ? Is the patient high risk? No     ? Does the patient require a Care Management Plan? No     ? Does the patient require physician intervention or other additional services (i.e. nutrition, smoking cessation, social work)? No      Shallon Yaklin A [facility] Specialty Pharmacist

## 2018-12-22 NOTE — Unmapped (Signed)
[facility] Specialty Medication Onboarding    Specialty Medication: Yevonne Pax  Prior Authorization: Approved   Financial Assistance: No - copay  <$25  Final Copay/Day Supply: $3 / 28 DAYS    Insurance Restrictions: None     Notes to Pharmacist: PER MD COMMENTS: Please deliver to [facility] [address] [place]. Injection scheduled for 10/15    The triage team has completed the benefits investigation and has determined that the patient is able to fill this medication at [facility]. Please contact the patient to complete the onboarding or follow up with the prescribing physician as needed.

## 2018-12-28 MED FILL — ABILIFY MAINTENA 400 MG INTRAMUSCULAR SUSPENSION,EXTENDED RELEASE: 28 days supply | Qty: 1 | Fill #0 | Status: AC

## 2018-12-30 NOTE — Unmapped (Signed)
Telephone call to Ms. Mckiernan regarding her missed in person appointment for follow up with this writer and injection today. Left voicemail.

## 2019-01-05 NOTE — Unmapped (Signed)
Error

## 2019-01-12 NOTE — Unmapped (Signed)
Ms. Paulsen still has one dose at clinic.  No refill needed at this time.    Horace Porteous, PharmD  [facility]

## 2019-01-26 NOTE — Unmapped (Signed)
[facility] Refill Coordination Note    Specialty Medication(s) to be Shipped:   General Specialty: none    Other medication(s) to be shipped: none     Jennifer Townsend, DOB: [birth year]  Phone: [phone] (home) [phone] (work)      All above HIPAA information was verified with RN     Completed refill call assessment today to schedule patient's medication shipment from the [facility] [phone]).       Specialty medication(s) and dose(s) confirmed: Regimen is correct and unchanged.   Changes to medications: NA  Changes to insurance: No  Questions for the pharmacist: No    Confirmed patient received Welcome Packet with first shipment. The patient will receive a drug information handout for each medication shipped and additional FDA Medication Guides as required.       DISEASE/MEDICATION-SPECIFIC INFORMATION        For patients on injectable medications: Patient currently has 1 doses left.  Next injection is scheduled for ASAP.    SPECIALTY MEDICATION ADHERENCE     Medication Adherence    Patient reported X missed doses in the last month: 1  Specialty Medication: Abilify Maintena  Patient is on additional specialty medications: No  Patient is on more than two specialty medications: No  Any gaps in refill history greater than 2 weeks in the last 3 months: yes  Reliability of informant: reliable  Patient is at risk for Non-Adherence: Yes   Other non-adherence reason: no show at clinic appointments   Confirmed plan for next specialty medication refill: delivery by pharmacy  Refills needed for supportive medications: not needed          Refill Coordination    Has the Patients' Contact Information Changed: No  Is the Shipping Address Different: No       Abilify Maintena 400 mg: 28 days of medicine on hand       SHIPPING     Shipping address confirmed in Epic.     Delivery Scheduled: Clinic declined delivery due to having 1 dose on hand Medication will be delivered via NA to the NA address in Epic WAM.    Andy Moye A [facility] Specialty Pharmacist

## 2019-03-23 NOTE — Unmapped (Signed)
[facility] Clinic Administered Medication Refill Coordination Note    Pt has been a no show for past visit.  Clinic has 1 dose on hand.  Patient was contacted regarding missed dose - no answer so LVM.    NAME:Jennifer Townsend DOB: 1981/01/26      Medication: Abilify Maintena  Day Supply: 28 days      SHIPPING      Next delivery from [facility] [phone]) to [name] is scheduled for NA.    Clinic contact: Raden DeFreitas    Patient's next nurse visit for administration: unknown. Pt has been a no show for past visit.  Clinic has 1 dose on hand.    We will follow up with clinic monthly for standard refill processing and delivery.      Kermit Balo  Specialty Pharmacy Pharmacist

## 2019-04-19 NOTE — Unmapped (Signed)
Clinic still has 1 dose remaining on hand.  No delivery scheduled at this time.    Horace Porteous, PharmD  [facility]

## 2019-05-09 NOTE — Unmapped (Signed)
This patient is receiving Abilify Maintena as a clinic administered medication. Patient is aware of copay of $3. The patient prefers all future communication to occur with the clinic. This patient is being disenrolled from our specialty management program and will be added to a patient list for appropriate follow up.    Regis Bill, PharmD, BCGP, CSP  [facility]  [address]  [place], [place] [address]  ph: [phone]  f: [phone]

## 2019-05-16 NOTE — Unmapped (Signed)
This patient has been disenrolled from the [facility] specialty pharmacy services due to patient no longer been seen at clinic.    Jennifer Townsend  [facility] Specialty Pharmacist

## 2019-06-30 ENCOUNTER — Emergency Department
Admission: EM | Admit: 2019-06-30 | Discharge: 2019-07-01 | Disposition: A | Discharge status: Home / Self Care | Payer: Medicaid Other | Attending: Student | Admitting: Student

## 2019-06-30 ENCOUNTER — Other Ambulatory Visit: Payer: Self-pay

## 2019-06-30 DIAGNOSIS — R462 Strange and inexplicable behavior: Secondary | ICD-10-CM | POA: Diagnosis present

## 2019-06-30 DIAGNOSIS — F1514 Other stimulant abuse with stimulant-induced mood disorder: Secondary | ICD-10-CM | POA: Insufficient documentation

## 2019-06-30 DIAGNOSIS — F15950 Other stimulant use, unspecified with stimulant-induced psychotic disorder with delusions: Secondary | ICD-10-CM | POA: Diagnosis present

## 2019-06-30 DIAGNOSIS — F17210 Nicotine dependence, cigarettes, uncomplicated: Secondary | ICD-10-CM | POA: Diagnosis not present

## 2019-06-30 DIAGNOSIS — F1910 Other psychoactive substance abuse, uncomplicated: Secondary | ICD-10-CM

## 2019-06-30 DIAGNOSIS — R4689 Other symptoms and signs involving appearance and behavior: Secondary | ICD-10-CM

## 2019-06-30 DIAGNOSIS — F1510 Other stimulant abuse, uncomplicated: Secondary | ICD-10-CM | POA: Diagnosis present

## 2019-06-30 DIAGNOSIS — Z79899 Other long term (current) drug therapy: Secondary | ICD-10-CM | POA: Diagnosis not present

## 2019-06-30 DIAGNOSIS — F32A Depression, unspecified: Secondary | ICD-10-CM

## 2019-06-30 DIAGNOSIS — F329 Major depressive disorder, single episode, unspecified: Secondary | ICD-10-CM | POA: Diagnosis not present

## 2019-06-30 LAB — COMPREHENSIVE METABOLIC PANEL
ALT: 18 U/L (ref 0–44)
AST: 16 U/L (ref 15–41)
Albumin: 3.9 g/dL (ref 3.5–5.0)
Alkaline Phosphatase: 66 U/L (ref 38–126)
Anion gap: 9 (ref 5–15)
BUN: 14 mg/dL (ref 6–20)
CO2: 28 mmol/L (ref 22–32)
Calcium: 8.8 mg/dL — ABNORMAL LOW (ref 8.9–10.3)
Chloride: 105 mmol/L (ref 98–111)
Creatinine, Ser: 0.8 mg/dL (ref 0.44–1.00)
GFR calc Af Amer: >60 mL/min (ref >60)
GFR calc non Af Amer: >60 mL/min (ref >60)
Glucose, Bld: 113 mg/dL — ABNORMAL HIGH (ref 70–99)
Potassium: 4.3 mmol/L (ref 3.5–5.1)
Sodium: 142 mmol/L (ref 135–145)
Total Bilirubin: 0.6 mg/dL (ref 0.3–1.2)
Total Protein: 6.7 g/dL (ref 6.5–8.1)

## 2019-06-30 LAB — URINALYSIS, COMPLETE (UACMP) WITH MICROSCOPIC
Bacteria, UA: NONE SEEN
Bilirubin Urine: NEGATIVE
Glucose, UA: NEGATIVE mg/dL
Hgb urine dipstick: NEGATIVE
Ketones, ur: NEGATIVE mg/dL
Leukocytes,Ua: NEGATIVE
Nitrite: NEGATIVE
Protein, ur: NEGATIVE mg/dL
RBC / HPF: >50 RBC/hpf — ABNORMAL HIGH (ref 0–5)
Specific Gravity, Urine: 1.021 (ref 1.005–1.030)
pH: 5 (ref 5.0–8.0)

## 2019-06-30 LAB — CBC
HCT: 36.7 % (ref 36.0–46.0)
Hemoglobin: 12.1 g/dL (ref 12.0–15.0)
MCH: 32.7 pg (ref 26.0–34.0)
MCHC: 33 g/dL (ref 30.0–36.0)
MCV: 99.2 fL (ref 80.0–100.0)
Platelets: 387 10*3/uL (ref 150–400)
RBC: 3.7 MIL/uL — ABNORMAL LOW (ref 3.87–5.11)
RDW: 14.2 % (ref 11.5–15.5)
WBC: 9.2 10*3/uL (ref 4.0–10.5)
nRBC: 0 % (ref 0.0–0.2)

## 2019-06-30 LAB — URINE DRUG SCREEN, QUALITATIVE (ARMC ONLY)
Amphetamines, Ur Screen: POSITIVE — AB
Barbiturates, Ur Screen: NOT DETECTED
Benzodiazepine, Ur Scrn: NOT DETECTED
Cannabinoid 50 Ng, Ur ~~LOC~~: NOT DETECTED
Cocaine Metabolite,Ur ~~LOC~~: NOT DETECTED
MDMA (Ecstasy)Ur Screen: NOT DETECTED
Methadone Scn, Ur: NOT DETECTED
Opiate, Ur Screen: NOT DETECTED
Phencyclidine (PCP) Ur S: NOT DETECTED
Tricyclic, Ur Screen: POSITIVE — AB

## 2019-06-30 LAB — ETHANOL: Alcohol, Ethyl (B): <10 mg/dL (ref <10)

## 2019-06-30 LAB — POCT PREGNANCY, URINE: Preg Test, Ur: NEGATIVE

## 2019-06-30 NOTE — ED Provider Notes (Signed)
[facility] Emergency Department Provider Note  ____________________________________________   First MD Initiated Contact with Patient 06/30/19 2312     (approximate)  I have reviewed the triage vital signs and the nursing notes.  History  Chief Complaint Psychiatric Evaluation    HPI Tamara Nguyen is a 39 y.o. female past medical history as below, who presents to the emergency department under IVC.  Per IVC paperwork "respondent is using methamphetamine.  She is a danger to herself and her family.  Today she is throwing things at her family telling them she is going to hurt them and she is yelling at people in the woods that are not there."  In triage, told the RN she is having thoughts of hurting others in the setting of her husband's infidelity.  She is calm and cooperative on arrival.    Past Medical Hx Past Medical History:  Diagnosis Date  . Major depressive disorder     Problem List Patient Active Problem List   Diagnosis Date Noted  . Amphetamine abuse (HCC) 12/01/2016  . 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 PANCREAS SURGERY      Medications Prior to Admission medications   Medication Sig Start Date End Date Taking? Authorizing Provider  ARIPiprazole ER (ABILIFY MAINTENA) 400 MG SRER injection Inject 2 mLs (400 mg total) into the muscle every 28 (twenty-eight) days. Due 5/16 07/01/18   Malvin Johns, MD  benztropine (COGENTIN) 0.5 MG tablet Take 1 tablet (0.5 mg total) by mouth daily. 07/02/18   Malvin Johns, MD  gabapentin (NEURONTIN) 300 MG capsule Take 1 capsule (300 mg total) by mouth 3 (three) times daily. 07/01/18   Malvin Johns, MD  haloperidol (HALDOL) 10 MG tablet Take 1 tablet (10 mg total) by mouth at bedtime. 07/01/18   Malvin Johns, MD  omeprazole (PRILOSEC) 20 MG capsule Take 20 mg by mouth daily. 06/02/18    [provider]  Pancrelipase, Lip-Prot-Amyl, 3000-9500 units CPEP Take 1 capsule by mouth 3 (three) times daily with meals.     [provider]  prochlorperazine (COMPAZINE) 10 MG tablet Take 10 mg by mouth daily as needed for nausea or vomiting.    [provider]  traZODone (DESYREL) 150 MG tablet Take 1 tablet (150 mg total) by mouth at bedtime as needed for sleep. 07/01/18   Malvin Johns, MD    Allergies Patient has no known allergies.  Family Hx No family history on file.  Social Hx Social History   Tobacco Use  . Smoking status: Current Some Day Smoker  . Smokeless tobacco: Never Used  Substance Use Topics  . Alcohol use: No  . Drug use: Not on file     Review of Systems  Constitutional: Negative for fever, chills. Eyes: Negative for visual changes. ENT: Negative for sore throat. Cardiovascular: Negative for chest pain. Respiratory: Negative for shortness of breath. Gastrointestinal: Negative for nausea, vomiting.  Genitourinary: Negative for dysuria. Musculoskeletal: Negative for leg swelling. Skin: Negative for rash. Neurological: Negative for for headaches.   Physical Exam  Vital Signs: ED Triage Vitals  Enc Vitals Group     BP 06/30/19 2135 116/81     Pulse Rate 06/30/19 2135 84     Resp 06/30/19 2135 20     Temp 06/30/19 2135 98.2 F (36.8 C)     Temp Source 06/30/19 2135 Oral     SpO2  06/30/19 2135 99 %     Weight 06/30/19 2134 120 lb (54.4 kg)     Height 06/30/19 2134 5\' 1"  (1.549 m)     Head Circumference --      Peak Flow --      Pain Score --      Pain Loc --      Pain Edu? --      Excl. in [place]? --     Constitutional: Alert and oriented.  Head: Normocephalic. Atraumatic. Eyes: Conjunctivae clear. Sclera anicteric. Nose: No congestion. No rhinorrhea. Mouth/Throat: Mucous membranes are moist.  Neck: No stridor.   Cardiovascular: Normal rate. Extremities well perfused. Respiratory: Normal respiratory effort.     Gastrointestinal: Non-distended.  Musculoskeletal: No deformities. Neurologic:  Normal speech and language. No gross focal neurologic deficits are appreciated.  Skin: Skin is warm, dry and intact.  Psychiatric: Calm and cooperative.  EKG  N/A    Radiology  N/A   Procedures  Procedure(s) performed (including critical care):  Procedures   Initial Impression / Assessment and Plan / ED Course  39 y.o. female who presents to the ED under IVC, as above.  Will obtain basic screening labs and consult psychiatry and TTS.   Work up notable for UDS positive for amphetamines, tricyclics. Suspect substance use contributing to her presentation.  Remainder of labs w/o actionable derangements.   The patient has been placed in psychiatric observation due to the need to provide a safe environment for the patient while obtaining psychiatric consultation and evaluation, as well as ongoing medical and medication management to treat the patient's condition.  The patient has been placed under full IVC at this time.   Clinical Course as of Jun 30 1329  Fri Jul 01, 2019  0243 Psychiatry has seen and evaluated.  Feel patient's presentation likely substance-induced related.  Will monitor overnight and reevaluate in the morning.   [SM]    Clinical Course User Index [SM] Lilia Pro., MD      Final Clinical Impression(s) / ED Diagnosis  Final diagnoses:  Involuntary commitment       Note:  This document was prepared using Dragon voice recognition software and may include unintentional dictation errors.   Lilia Pro., MD 07/01/19 1331

## 2019-06-30 NOTE — ED Notes (Signed)
Removed: gray sweatshirt Wallace Cullens sports bra Black bra Black sweatpants Black slippers 1 pair panties
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
## 2019-07-01 NOTE — Consult Note (Signed)
Patient has been stable and calm in the ED. No SI or HI or evidence that she intends or might cause harm to herself or others. IVC rescinded. Pt ready for D/C

## 2019-07-01 NOTE — ED Notes (Signed)
Lunch provided. Awaiting disposition
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
## 2019-07-01 NOTE — ED Notes (Signed)
Assumed care of patient this morning. Easily awakens to name. Refuses to answer any questions. Pulled cover over head and went back to sleep. Will continue to monitor. Safety maintained.  

## 2019-07-01 NOTE — ED Notes (Signed)
Patient out of bed to bathroom came back and went back to sleep, covers over head.

## 2019-07-01 NOTE — ED Notes (Signed)
Pt to interview room with Annice Pih, NP and Engineer, materials.

## 2019-07-01 NOTE — ED Provider Notes (Signed)
patient cleared for discharge by psychiatry   Jene Every, MD 07/01/19 1335

## 2019-07-01 NOTE — BH Assessment (Signed)
Writer spoke with the patient to complete an updated/reassessment. Patient denies SI/HI and AV/H.  Patient does not meet inpatient criteria. 

## 2019-07-01 NOTE — ED Notes (Signed)
Pt given breakfast tray

## 2019-07-01 NOTE — ED Notes (Signed)
IVC rescinded/ Consult completed/ Pt to be D/C'd

## 2019-07-01 NOTE — ED Notes (Signed)
Psych and TTS at bedside to consult patient. Plan to discharge to home with out patient resources IVC rescinded. Awaiting discharge papers.

## 2019-08-20 DIAGNOSIS — K860 Alcohol-induced chronic pancreatitis: Principal | ICD-10-CM

## 2019-08-20 MED ORDER — CREON 3,000 UNIT-9,500 UNIT-15,000 UNIT CAPSULE,DELAYED RELEASE
ORAL_CAPSULE | 0 refills | 0 days
Start: 2019-08-20 — End: ?

## 2019-08-22 MED ORDER — CREON 3,000 UNIT-9,500 UNIT-15,000 UNIT CAPSULE,DELAYED RELEASE
ORAL_CAPSULE | 0 refills | 0 days
Start: 2019-08-22 — End: ?

## 2019-12-14 ENCOUNTER — Ambulatory Visit
Admit: 2019-12-14 | Discharge: 2019-12-15 | Payer: MEDICAID | Attending: Student in an Organized Health Care Education/Training Program | Primary: Student in an Organized Health Care Education/Training Program

## 2019-12-14 DIAGNOSIS — K8689 Other specified diseases of pancreas: Principal | ICD-10-CM

## 2019-12-14 DIAGNOSIS — F419 Anxiety disorder, unspecified: Principal | ICD-10-CM

## 2019-12-14 DIAGNOSIS — K860 Alcohol-induced chronic pancreatitis: Principal | ICD-10-CM

## 2019-12-14 DIAGNOSIS — F3181 Bipolar II disorder: Principal | ICD-10-CM

## 2019-12-14 MED ORDER — PROCHLORPERAZINE MALEATE 10 MG TABLET
ORAL_TABLET | Freq: Three times a day (TID) | ORAL | 3 refills | 30.00000 days | Status: CP | PRN
Start: 2019-12-14 — End: 2020-03-13

## 2019-12-14 MED ORDER — LIPASE 3,000-PROTEASE 9,500-AMYLASE 15,000 UNIT CAPSULE, DELAYED REL: 1 | capsule | Freq: Three times a day (TID) | 3 refills | 90 days | Status: AC

## 2019-12-14 MED ORDER — HYDROXYZINE HCL 25 MG TABLET
ORAL_TABLET | Freq: Two times a day (BID) | ORAL | 3 refills | 90.00000 days | Status: CP | PRN
Start: 2019-12-14 — End: 2020-03-13

## 2019-12-14 MED ORDER — BUSPIRONE 15 MG TABLET
ORAL_TABLET | Freq: Three times a day (TID) | ORAL | 3 refills | 90 days | Status: CP
Start: 2019-12-14 — End: 2020-04-12

## 2019-12-14 MED ORDER — LIPASE 3,000-PROTEASE 9,500-AMYLASE 15,000 UNIT CAPSULE, DELAYED REL
ORAL_CAPSULE | Freq: Three times a day (TID) | ORAL | 3 refills | 90.00000 days | Status: CP
Start: 2019-12-14 — End: 2019-12-14

## 2019-12-29 ENCOUNTER — Other Ambulatory Visit: Payer: Self-pay

## 2019-12-29 ENCOUNTER — Emergency Department
Admission: EM | Admit: 2019-12-29 | Discharge: 2020-01-02 | Disposition: A | Discharge status: Home / Self Care | Payer: Medicaid Other | Attending: Emergency Medicine | Admitting: Emergency Medicine

## 2019-12-29 DIAGNOSIS — F17200 Nicotine dependence, unspecified, uncomplicated: Secondary | ICD-10-CM | POA: Insufficient documentation

## 2019-12-29 DIAGNOSIS — F319 Bipolar disorder, unspecified: Secondary | ICD-10-CM | POA: Diagnosis present

## 2019-12-29 DIAGNOSIS — R4689 Other symptoms and signs involving appearance and behavior: Secondary | ICD-10-CM

## 2019-12-29 DIAGNOSIS — R456 Violent behavior: Secondary | ICD-10-CM | POA: Diagnosis not present

## 2019-12-29 DIAGNOSIS — Z046 Encounter for general psychiatric examination, requested by authority: Secondary | ICD-10-CM | POA: Diagnosis not present

## 2019-12-29 DIAGNOSIS — F1910 Other psychoactive substance abuse, uncomplicated: Secondary | ICD-10-CM | POA: Insufficient documentation

## 2019-12-29 DIAGNOSIS — Z20822 Contact with and (suspected) exposure to covid-19: Secondary | ICD-10-CM | POA: Insufficient documentation

## 2019-12-29 LAB — COMPREHENSIVE METABOLIC PANEL
ALT: 17 U/L (ref 0–44)
AST: 30 U/L (ref 15–41)
Albumin: 4.3 g/dL (ref 3.5–5.0)
Alkaline Phosphatase: 66 U/L (ref 38–126)
Anion gap: 11 (ref 5–15)
BUN: 23 mg/dL — ABNORMAL HIGH (ref 6–20)
CO2: 20 mmol/L — ABNORMAL LOW (ref 22–32)
Calcium: 8.7 mg/dL — ABNORMAL LOW (ref 8.9–10.3)
Chloride: 107 mmol/L (ref 98–111)
Creatinine, Ser: 0.77 mg/dL (ref 0.44–1.00)
GFR, Estimated: >60 mL/min (ref >60)
Glucose, Bld: 165 mg/dL — ABNORMAL HIGH (ref 70–99)
Potassium: 3.8 mmol/L (ref 3.5–5.1)
Sodium: 138 mmol/L (ref 135–145)
Total Bilirubin: 1.4 mg/dL — ABNORMAL HIGH (ref 0.3–1.2)
Total Protein: 7.6 g/dL (ref 6.5–8.1)

## 2019-12-29 LAB — CBC WITH DIFFERENTIAL/PLATELET
Abs Immature Granulocytes: 0.04 10*3/uL (ref 0.00–0.07)
Basophils Absolute: 0.1 10*3/uL (ref 0.0–0.1)
Basophils Relative: 1 %
Eosinophils Absolute: 0.2 10*3/uL (ref 0.0–0.5)
Eosinophils Relative: 2 %
HCT: 38.7 % (ref 36.0–46.0)
Hemoglobin: 12.9 g/dL (ref 12.0–15.0)
Immature Granulocytes: 0 %
Lymphocytes Relative: 17 %
Lymphs Abs: 2.2 10*3/uL (ref 0.7–4.0)
MCH: 32.3 pg (ref 26.0–34.0)
MCHC: 33.3 g/dL (ref 30.0–36.0)
MCV: 96.8 fL (ref 80.0–100.0)
Monocytes Absolute: 1.5 10*3/uL — ABNORMAL HIGH (ref 0.1–1.0)
Monocytes Relative: 11 %
Neutro Abs: 8.9 10*3/uL — ABNORMAL HIGH (ref 1.7–7.7)
Neutrophils Relative %: 69 %
Platelets: 410 10*3/uL — ABNORMAL HIGH (ref 150–400)
RBC: 4 MIL/uL (ref 3.87–5.11)
RDW: 14 % (ref 11.5–15.5)
WBC: 12.8 10*3/uL — ABNORMAL HIGH (ref 4.0–10.5)
nRBC: 0 % (ref 0.0–0.2)

## 2019-12-29 MED ORDER — HALOPERIDOL LACTATE 5 MG/ML IJ SOLN
INTRAMUSCULAR | Status: AC
  Administered 2019-12-29: 5 mg
  Filled 2019-12-29: qty 1

## 2019-12-29 MED ORDER — LORAZEPAM 2 MG/ML IJ SOLN
INTRAMUSCULAR | Status: AC
  Administered 2019-12-29: 2 mg
  Filled 2019-12-29: qty 1

## 2019-12-29 NOTE — ED Notes (Signed)
Patient came via police in handcuffs. She is uncooperative, verbally aggressive and loud. She refused to change out in Hospital scrub. EDP and charge nurse aware.

## 2019-12-29 NOTE — ED Notes (Signed)
Pt asleep.  Forensic restraints removed by Technical sales engineer in [place].  Pt cooperative.  Labs drawn, vitals obtained and pt dressed out in burgundy scrubs.  Belongings include sandals, grey sweatpants, grey tshirt, grey sports bra, underwear.  Items bagged by Hewan RN and placed at nurses station.  Pt back to sleep, warm blanket given and lights out

## 2019-12-29 NOTE — ED Notes (Signed)
Hourly rounding reveals patient in room. No complaints, stable, in no acute distress. Q15 minute rounds and monitoring via Rover and Officer to continue.   
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
## 2019-12-30 LAB — URINE DRUG SCREEN, QUALITATIVE (ARMC ONLY)
Amphetamines, Ur Screen: POSITIVE — AB
Barbiturates, Ur Screen: NOT DETECTED
Benzodiazepine, Ur Scrn: NOT DETECTED
Cannabinoid 50 Ng, Ur ~~LOC~~: NOT DETECTED
Cocaine Metabolite,Ur ~~LOC~~: NOT DETECTED
MDMA (Ecstasy)Ur Screen: NOT DETECTED
Methadone Scn, Ur: NOT DETECTED
Opiate, Ur Screen: NOT DETECTED
Phencyclidine (PCP) Ur S: NOT DETECTED
Tricyclic, Ur Screen: POSITIVE — AB

## 2019-12-30 LAB — RESPIRATORY PANEL BY RT PCR (FLU A&B, COVID)
Influenza A by PCR: NEGATIVE
Influenza B by PCR: NEGATIVE
SARS Coronavirus 2 by RT PCR: NEGATIVE

## 2019-12-30 LAB — ETHANOL: Alcohol, Ethyl (B): <10 mg/dL (ref <10)

## 2019-12-30 NOTE — ED Notes (Signed)
Hourly rounding reveals patient in room. No complaints, stable, in no acute distress. Q15 minute rounds and monitoring via Rover and Officer to continue.   

## 2019-12-30 NOTE — ED Notes (Signed)
Pt. To BHU from ED ambulatory without difficulty, to room  BHU2. Report from [facility]. Pt. Is alert and oriented, warm and dry in no distress. Pt. Denies SI, HI, and AVH. Pt. Calm and cooperative. Pt. Made aware of security cameras and Q15 minute rounds. Pt. Encouraged to let Nursing staff know of any concerns or needs.

## 2019-12-30 NOTE — ED Provider Notes (Signed)
Dr. Smith Sebastion Jun recommends admission   Jene Every, MD 12/30/19 1023

## 2019-12-30 NOTE — ED Notes (Signed)

## 2019-12-30 NOTE — BH Assessment (Signed)
Pt unable to participate in an assessment due to erratic and aggressive behavior. Pt has been medicated and will need to be assessed at a later time.

## 2019-12-30 NOTE — ED Provider Notes (Signed)
Emergency Medicine Observation Re-evaluation Note  Tamara Nguyen is a 39 y.o. female, seen on rounds today.  Pt initially presented to the ED for complaints of Medical Clearance Currently, the patient is sleeping comfortably, complains only of being hungry when woken.  Physical Exam  BP 116/81 (BP Location: Right Arm)   Pulse 75   Temp 99.3 F (37.4 C) (Oral)   Resp 18   Ht 5' (1.524 m)   Wt 49.9 kg   SpO2 98%   BMI 21.48 kg/m  Physical Exam Constitutional: Resting comfortably. Eyes: Conjunctivae are normal. Head: Atraumatic. Nose: No congestion/rhinnorhea. Mouth/Throat: Mucous membranes are moist. Neck: Normal ROM Cardiovascular: No cyanosis noted. Respiratory: Normal respiratory effort. Gastrointestinal: Non-distended. Genitourinary: deferred Musculoskeletal: No lower extremity tenderness nor edema. Neurologic:  Normal speech and language. No gross focal neurologic deficits are appreciated. Skin:  Skin is warm, dry and intact. No rash noted.    ED Course / MDM  EKG:    I have reviewed the labs performed to date as well as medications administered while in observation.  Recent changes in the last 24 hours include patient required chemical restraints due to agitation, now much calmer.  Plan  Current plan is for further psychiatric evaluation now that she is more calm. Patient is under full IVC at this time.   Chesley Noon, MD 12/30/19 [phone]

## 2019-12-30 NOTE — ED Notes (Signed)
Provided patient with graham crackers and apple juice.

## 2019-12-30 NOTE — BH Assessment (Signed)
PATIENT BED AVAILABLE AFTER 7:30AM for 12/31/19  Patient has been accepted to [facility].  Patient assigned to [address] Bed 1 Attending physician is Dr. Tamera Punt.  Call report to [phone] Representative was [facility] Kim.   ER Staff is aware of it:  Melody ER Secretary  Dr. Derrill Kay, ER MD  Selena Batten Patient's Nurse
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
## 2019-12-30 NOTE — ED Notes (Signed)
Patient talking with psychiatry team

## 2019-12-30 NOTE — BH Assessment (Addendum)
Due to high acuity on the unit, TTS contacted [facility]) to review pt for admission.   Pt is currently under review  3:45pm per [facility] pt is accepted to [facility] TTS awaiting final details that Debbe Bales agrees to provide later today

## 2019-12-30 NOTE — BH Assessment (Signed)
[facility]) contact TTS requesting COVID results before finalizing acceptance.   TTS consulted with Pt's current RN Geralynn Ochs).

## 2019-12-30 NOTE — ED Notes (Signed)
IVC/Pending transport to [facility] in the AM.

## 2019-12-31 NOTE — ED Notes (Signed)
Mom of Pt dropped of a black bag with Pt's belongings in it. I placed that black bag in the locked storage unit in the BHU where we keep personal belongings at while PTs are roomed in the [place].

## 2019-12-31 NOTE — ED Notes (Signed)
Pt states she has not seen a doctor and has no idea why she is here.  RN informed patient psychiatry spoke with her yesterday and she will be transferred to [facility].

## 2019-12-31 NOTE — ED Notes (Signed)
Pt given lunch tray.

## 2019-12-31 NOTE — BH Assessment (Addendum)
Spoke with ED secretary Christen Bame about pt's ability to transport to [facility]. Per Christen Bame the sheriff department is unable to transport female patients today. [facility] notified.

## 2019-12-31 NOTE — ED Provider Notes (Signed)
Emergency Medicine Observation Re-evaluation Note  Tamara Nguyen is a 39 y.o. female, seen on rounds today.  Pt initially presented to the ED for complaints of Medical Clearance Currently, the patient is walking outside of her room, denies complaints.  Physical Exam  BP 102/74 (BP Location: Right Arm)   Pulse 83   Temp 97.9 F (36.6 C) (Oral)   Resp 18   Ht 5' (1.524 m)   Wt 49.9 kg   SpO2 100%   BMI 21.48 kg/m  Physical Exam Constitutional: Resting comfortably. Eyes: Conjunctivae are normal. Head: Atraumatic. Nose: No congestion/rhinnorhea. Mouth/Throat: Mucous membranes are moist. Neck: Normal ROM Cardiovascular: No cyanosis noted. Respiratory: Normal respiratory effort. Gastrointestinal: Non-distended. Genitourinary: deferred Musculoskeletal: No lower extremity tenderness nor edema. Neurologic:  Normal speech and language. No gross focal neurologic deficits are appreciated. Skin:  Skin is warm, dry and intact. No rash noted.    ED Course / MDM  EKG:    I have reviewed the labs performed to date as well as medications administered while in observation.  Recent changes in the last 24 hours include none.  Plan  Current plan is for admission to [facility] today. Patient is under full IVC at this time.   Chesley Noon, MD 12/31/19 [phone]

## 2019-12-31 NOTE — ED Notes (Signed)
Pt given dinner tray.

## 2020-01-01 MED ORDER — PANCRELIPASE (LIP-PROT-AMYL) 3000-9500 UNITS PO CPEP: 1.0000 | ORAL_CAPSULE | Freq: Three times a day (TID) | ORAL | Status: DC

## 2020-01-01 MED ORDER — HYDROXYZINE HCL 25 MG PO TABS
25.0000 mg | ORAL_TABLET | Freq: Two times a day (BID) | ORAL | Status: DC
  Administered 2020-01-01 (×2): 25 mg via ORAL
  Filled 2020-01-01 (×2): qty 1

## 2020-01-01 MED ORDER — PANCRELIPASE (LIP-PROT-AMYL) 12000-38000 UNITS PO CPEP
12000.0000 [IU] | ORAL_CAPSULE | Freq: Three times a day (TID) | ORAL | Status: DC
  Administered 2020-01-01 (×2): 12000 [IU] via ORAL
  Filled 2020-01-01 (×7): qty 1

## 2020-01-01 MED ORDER — PROCHLORPERAZINE MALEATE 10 MG PO TABS
10.0000 mg | ORAL_TABLET | Freq: Three times a day (TID) | ORAL | Status: DC | PRN
  Filled 2020-01-01: qty 1

## 2020-01-01 MED ORDER — BUSPIRONE HCL 10 MG PO TABS
15.0000 mg | ORAL_TABLET | Freq: Three times a day (TID) | ORAL | Status: DC
  Administered 2020-01-01 (×2): 15 mg via ORAL
  Filled 2020-01-01 (×2): qty 2

## 2020-01-01 NOTE — ED Notes (Signed)

## 2020-01-01 NOTE — ED Notes (Signed)
Report to amy, rn

## 2020-01-01 NOTE — ED Notes (Signed)
Patient is IVC pending [facility],will have to go on 01/02/20 due to staffing issue

## 2020-01-01 NOTE — ED Notes (Signed)
Dinner tray given. No other needs found at this moment.  ?

## 2020-01-01 NOTE — ED Notes (Signed)
Pt given a sandwich tray, sprite, and vanilla ice cream cup

## 2020-01-01 NOTE — ED Provider Notes (Signed)
Emergency Medicine Observation Re-evaluation Note  Tamara Nguyen is a 39 y.o. female, seen on rounds today.  Pt initially presented to the ED for complaints of Medical Clearance Currently, the patient is resting.  Physical Exam  BP 103/72   Pulse 79   Temp 98.7 F (37.1 C) (Oral)   Resp 16   Ht 5' (1.524 m)   Wt 49.9 kg   SpO2 98%   BMI 21.48 kg/m  Physical Exam General: nontoxic Cardiac: well perfused Lungs: even and unlabored resp Psych: calm  ED Course / MDM  EKG:    I have reviewed the labs performed to date as well as medications administered while in observation.  Recent changes in the last 24 hours include none.  Plan  Current plan is for psych dispo. Patient is under full IVC at this time.   Willy Eddy, MD 01/01/20 [phone]

## 2020-01-01 NOTE — Progress Notes (Signed)
Pt will not be able to come to [facility] tonight due to limited staffing or until notified that staffed beds have become available.

## 2020-01-01 NOTE — ED Notes (Signed)
Vital signs checked. Shower offered. Pt refused shower this morning, pt asked, "Does everyone uses the same shower?" This tech replied to the patient, "Yes, but we clean it after every used." This tech will offer pt a sponge bath later this morning.

## 2020-01-01 NOTE — Progress Notes (Signed)
Patient ID: Tamara Nguyen, female   DOB: 1980/03/31, 39 y.o.   MRN: [id number]    Very Brief progresss Remains on IVC awaiting bed transfer New meds pending, but home meds restarted  She needs closer  Look at antipsychotics, mood stabilizers  Not clear enough for discharge  Awaits bed   Downstairs full  [place] cannot accept    Rama Candise Bowens MD TTS aware of issues

## 2020-01-01 NOTE — ED Notes (Signed)
Breakfast tray given. No other needs found at this moment.  

## 2020-01-02 ENCOUNTER — Inpatient Hospital Stay (HOSPITAL_COMMUNITY)
Admission: AD | Admit: 2020-01-02 | Discharge: 2020-01-05 | DRG: 885 | Disposition: A | Discharge status: Home / Self Care | Payer: Medicaid Other | Source: Other Acute Inpatient Hospital | Attending: Psychiatry | Admitting: Psychiatry

## 2020-01-02 ENCOUNTER — Other Ambulatory Visit: Payer: Self-pay

## 2020-01-02 ENCOUNTER — Encounter (HOSPITAL_COMMUNITY): Payer: Self-pay | Admitting: Internal Medicine

## 2020-01-02 DIAGNOSIS — F312 Bipolar disorder, current episode manic severe with psychotic features: Secondary | ICD-10-CM | POA: Diagnosis not present

## 2020-01-02 DIAGNOSIS — F1510 Other stimulant abuse, uncomplicated: Secondary | ICD-10-CM | POA: Diagnosis present

## 2020-01-02 DIAGNOSIS — F15129 Other stimulant abuse with intoxication, unspecified: Secondary | ICD-10-CM | POA: Diagnosis present

## 2020-01-02 DIAGNOSIS — F15950 Other stimulant use, unspecified with stimulant-induced psychotic disorder with delusions: Secondary | ICD-10-CM | POA: Diagnosis present

## 2020-01-02 DIAGNOSIS — F410 Panic disorder [episodic paroxysmal anxiety] without agoraphobia: Secondary | ICD-10-CM | POA: Diagnosis present

## 2020-01-02 DIAGNOSIS — Z9119 Patient's noncompliance with other medical treatment and regimen: Secondary | ICD-10-CM | POA: Diagnosis not present

## 2020-01-02 DIAGNOSIS — F17210 Nicotine dependence, cigarettes, uncomplicated: Secondary | ICD-10-CM | POA: Diagnosis present

## 2020-01-02 DIAGNOSIS — F15150 Other stimulant abuse with stimulant-induced psychotic disorder with delusions: Secondary | ICD-10-CM | POA: Diagnosis present

## 2020-01-02 DIAGNOSIS — F333 Major depressive disorder, recurrent, severe with psychotic symptoms: Secondary | ICD-10-CM

## 2020-01-02 DIAGNOSIS — F323 Major depressive disorder, single episode, severe with psychotic features: Secondary | ICD-10-CM | POA: Diagnosis present

## 2020-01-02 MED ORDER — ALUM & MAG HYDROXIDE-SIMETH 200-200-20 MG/5ML PO SUSP: 30.0000 mL | ORAL | Status: DC | PRN

## 2020-01-02 MED ORDER — HYDROXYZINE HCL 25 MG PO TABS
25.0000 mg | ORAL_TABLET | Freq: Three times a day (TID) | ORAL | Status: DC | PRN
  Administered 2020-01-02 – 2020-01-05 (×7): 25 mg via ORAL
  Filled 2020-01-02 (×6): qty 1

## 2020-01-02 MED ORDER — MAGNESIUM HYDROXIDE 400 MG/5ML PO SUSP: 30.0000 mL | Freq: Every day | ORAL | Status: DC | PRN

## 2020-01-02 MED ORDER — OLANZAPINE 5 MG PO TABS
5.0000 mg | ORAL_TABLET | Freq: Every day | ORAL | Status: DC
  Administered 2020-01-02 – 2020-01-03 (×2): 5 mg via ORAL
  Filled 2020-01-02: qty 2
  Filled 2020-01-02 (×4): qty 1

## 2020-01-02 MED ORDER — LORAZEPAM 2 MG/ML IJ SOLN: 1.0000 mg | Freq: Two times a day (BID) | INTRAMUSCULAR | Status: DC | PRN

## 2020-01-02 MED ORDER — PANCRELIPASE (LIP-PROT-AMYL) 12000-38000 UNITS PO CPEP
12000.0000 [IU] | ORAL_CAPSULE | Freq: Three times a day (TID) | ORAL | Status: DC
  Administered 2020-01-02 – 2020-01-05 (×9): 12000 [IU] via ORAL
  Filled 2020-01-02 (×15): qty 1

## 2020-01-02 MED ORDER — ACETAMINOPHEN 325 MG PO TABS: 650.0000 mg | ORAL_TABLET | Freq: Four times a day (QID) | ORAL | Status: DC | PRN

## 2020-01-02 MED ORDER — LORAZEPAM 1 MG PO TABS
1.0000 mg | ORAL_TABLET | Freq: Two times a day (BID) | ORAL | Status: DC | PRN
  Administered 2020-01-02 – 2020-01-03 (×2): 1 mg via ORAL
  Filled 2020-01-02 (×2): qty 1

## 2020-01-02 MED ORDER — BENZTROPINE MESYLATE 1 MG PO TABS
1.0000 mg | ORAL_TABLET | Freq: Two times a day (BID) | ORAL | Status: DC | PRN
  Administered 2020-01-02: 1 mg via ORAL
  Filled 2020-01-02: qty 1

## 2020-01-02 MED ORDER — BENZTROPINE MESYLATE 1 MG/ML IJ SOLN: 1.0000 mg | Freq: Two times a day (BID) | INTRAMUSCULAR | Status: DC | PRN

## 2020-01-02 MED ORDER — HALOPERIDOL 5 MG PO TABS
10.0000 mg | ORAL_TABLET | Freq: Two times a day (BID) | ORAL | Status: DC | PRN
  Administered 2020-01-02 – 2020-01-04 (×2): 10 mg via ORAL
  Filled 2020-01-02 (×2): qty 2

## 2020-01-02 MED ORDER — HALOPERIDOL LACTATE 5 MG/ML IJ SOLN: 10.0000 mg | Freq: Two times a day (BID) | INTRAMUSCULAR | Status: DC | PRN

## 2020-01-02 NOTE — Progress Notes (Signed)
Adult Psychoeducational Group Note  Date:  01/02/2020 Time:  8:42 PM  Group Topic/Focus:  Wrap-Up Group:   The focus of this group is to help patients review their daily goal of treatment and discuss progress on daily workbooks.  Participation Level:  Did Not Attend  Participation Quality:  Did Not Attend  Affect:  Did Not Attend  Cognitive:  Did Not Attend  Insight: None  Engagement in Group:  Did Not Attend  Modes of Intervention:  Did Not Attend  Additional Comments:  Pt did not attend evening wrap up group tonight.  [name] 01/02/2020, 8:42 PM

## 2020-01-02 NOTE — Progress Notes (Signed)
Progress note  Pt became agitated at another pt when they continued to be intrusive with their phone conversation. Pt was threatening and using racial slurs toward the other pt after the aggressive pt used racial slurs toward them first. Pt was since calmed down and has been seen in the dayroom interacting appropriately with peers.
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
## 2020-01-02 NOTE — [facility] Counselor (Signed)
CSW attempted to meet with Pt for PSA completion. Pt was sleeping and could not be awaken. TOC team will attempt assessment tomorrow.  Jacinta Shoe, LCSW

## 2020-01-02 NOTE — Accreditation Note (Addendum)
TTS spoke with [facility] AC Tosin. Tosin advised TTS to follow up with [facility] regarding bed availability in the AM. Tosin explained that if staffing stays the same, pt is fine to be transported in the AM.  

## 2020-01-02 NOTE — [facility] Suicide Risk Assessment (Signed)
[facility] Admission Suicide Risk Assessment   Nursing information obtained from:  Patient Demographic factors:  Low socioeconomic status, Unemployed, Adolescent or young adult, Caucasian Current Mental Status:  Thoughts of violence towards others, Plan to harm others Loss Factors:  Decline in physical health, Financial problems / change in socioeconomic status Historical Factors:  Impulsivity Risk Reduction Factors:  Sense of responsibility to family, Positive social support, Living with another person, especially a relative, Positive therapeutic relationship  Total Time spent with patient: 45 minutes Principal Problem: <principal problem not specified> Diagnosis:  Active Problems:   Major depression with psychotic features (HCC)  Subjective Data: Patient is a 39 year old woman, formerly diagnosed with bipolar disorder, presented in a self-reported manic episode likely secondary to medication noncompliance and methamphetamine use.  Continued Clinical Symptoms:  Alcohol Use Disorder Identification Test Final Score (AUDIT): 0 The "Alcohol Use Disorders Identification Test", Guidelines for Use in Primary Care, Second Edition.  World Science writer [facility]). Score between 0-7:  no or low risk or alcohol related problems. Score between 8-15:  moderate risk of alcohol related problems. Score between 16-19:  high risk of alcohol related problems. Score 20 or above:  warrants further diagnostic evaluation for alcohol dependence and treatment.   CLINICAL FACTORS:   Panic Attacks Bipolar Disorder:   Mixed State Alcohol/Substance Abuse/Dependencies    COGNITIVE FEATURES THAT CONTRIBUTE TO RISK:  None    SUICIDE RISK:   Minimal: No identifiable suicidal ideation.  Patients presenting with no risk factors; may be classified as minimal risk based on the severity of the depressive symptoms  PLAN OF CARE: Patient will be admitted to inpatient unit for further treatment and stabilization.  I  certify that inpatient services furnished can reasonably be expected to improve the patient's condition.   Clement Sayres, MD 01/02/2020, 3:38 PM

## 2020-01-02 NOTE — Tx Team (Signed)
Initial Treatment Plan 01/02/2020 10:38 AM [name] CZY:[id number]    PATIENT STRESSORS: Financial difficulties Health problems Medication change or noncompliance Substance abuse   PATIENT STRENGTHS: Ability for insight Average or above average intelligence Communication skills Motivation for treatment/growth Supportive family/friends   PATIENT IDENTIFIED PROBLEMS: aggression  anxiety  Substance use/abuse  Self medicating  Medication noncompliance   Racing thoughts, "can't control"           DISCHARGE CRITERIA:  Ability to meet basic life and health needs Improved stabilization in mood, thinking, and/or behavior Motivation to continue treatment in a less acute level of care Need for constant or close observation no longer present  PRELIMINARY DISCHARGE PLAN: Attend aftercare/continuing care group Outpatient therapy Placement in alternative living arrangements Return to previous living arrangement  PATIENT/FAMILY INVOLVEMENT: This treatment plan has been presented to and reviewed with the patient, Tamara Nguyen.  The patient and family have been given the opportunity to ask questions and make suggestions.  Raylene Miyamoto, RN 01/02/2020, 10:38 AM

## 2020-01-02 NOTE — Progress Notes (Signed)
   01/02/20 2305  Psych Admission Type (Psych Patients Only)  Admission Status Involuntary  Psychosocial Assessment  Patient Complaints Anxiety  Eye Contact Fair  Facial Expression Animated;Anxious;Pensive;Worried  Affect Anxious;Irritable;Labile  Furniture conservator/restorer;Restless  Appearance/Hygiene Poor hygiene;In scrubs  Behavior Characteristics Cooperative  Mood Anxious  Thought Process  Coherency Concrete thinking  Content Blaming others;Blaming self  Delusions None reported or observed  Perception WDL  Hallucination None reported or observed  Judgment Poor  Confusion None  Danger to Self  Current suicidal ideation? Denies  Danger to Others  Danger to Others None reported or observed  D: Patient anxious but cooperative reports she is tolerating medication well. Pt denies any other needs.  A: Medications administered as prescribed. Support and encouragement provided as needed.  R: Patient remains safe on the unit. Will continue to monitor for safety and stability.

## 2020-01-03 DIAGNOSIS — F312 Bipolar disorder, current episode manic severe with psychotic features: Principal | ICD-10-CM

## 2020-01-03 LAB — LIPID PANEL
Cholesterol: 200 mg/dL (ref 0–200)
HDL: 46 mg/dL (ref >40)
LDL Cholesterol: 124 mg/dL — ABNORMAL HIGH (ref 0–99)
Total CHOL/HDL Ratio: 4.3 RATIO
Triglycerides: 152 mg/dL — ABNORMAL HIGH (ref <150)
VLDL: 30 mg/dL (ref 0–40)

## 2020-01-03 MED ORDER — OLANZAPINE 2.5 MG PO TABS
2.5000 mg | ORAL_TABLET | Freq: Every day | ORAL | Status: DC
  Administered 2020-01-03 – 2020-01-04 (×2): 2.5 mg via ORAL
  Filled 2020-01-03 (×4): qty 1

## 2020-01-03 NOTE — [facility] Suicide Risk Assessment (Signed)
[facility] INPATIENT:  Family/Significant Other Suicide Prevention Education  Suicide Prevention Education:  Education Completed; Lanora Manis [phone] (Mother)  has been identified by the patient as the family member/significant other with whom the patient will be residing, and identified as the person(s) who will aid the patient in the event of a mental health crisis (suicidal ideations/suicide attempt).  With written consent from the patient, the family member/significant other has been provided the following suicide prevention education, prior to the and/or following the discharge of the patient.  The suicide prevention education provided includes the following:  Suicide risk factors  Suicide prevention and interventions  National Suicide Hotline telephone number  [facility] assessment telephone number  [facility] Emergency Assistance 911  [facility] and/or Residential Mobile Crisis Unit telephone number  Request made of family/significant other to:  Remove weapons (e.g., guns, rifles, knives), all items previously/currently identified as safety concern.    Remove drugs/medications (over-the-counter, prescriptions, illicit drugs), all items previously/currently identified as a safety concern.  The family member/significant other verbalizes understanding of the suicide prevention education information provided.  The family member/significant other agrees to remove the items of safety concern listed above.   Mrs. Logan Bores states that her daughter has been through a [place]  Mrs. Logan Bores states that there is no one for her daughter to talk to out there and she believes that she uses drugs to cope with this. Mrs. Logan Bores states that her daughter often tells her that she is sick because she is holding in her urine and the "toxins" are making her sick.  Mrs. Logan Bores states that her daughter had a Psychiatrist in [place] that  provided her with medications.  Mrs. Logan Bores states that her daughter was in the hospital for a year and during this time had her Pancreas removed.  Mrs. Logan Bores states tha her daughter attempted to get with [facility] but did not receive a call back.  Mrs. Logan Bores states that she is not sure if there are firearms in the home and that her daughter was living with her ex-husband before coming to the hospital. Mrs. Logan Bores states that she is willing to let her daughter live with her if she does not want to go to a rehabilitation facility.  Mrs. Logan Bores states that her daughter just started talking to her a year ago and states that she will continue to work with her daughter to make sure she goes to appointments and takes her medication.      Metro Kung Kenniyah Sasaki 01/03/2020, 2:35 PM

## 2020-01-03 NOTE — [facility] Group Notes (Signed)
[facility] Group Notes:  (Nursing/MHT/Case Management/Adjunct)  Date:  01/03/2020  Time:  1:29 PM  Type of Therapy:  Group Therapy  Participation Level:  Active  Participation Quality:  Appropriate  Affect:  Appropriate  Cognitive:  Appropriate  Insight:  Improving  Engagement in Group:  Engaged  Modes of Intervention:  Orientation  Summary of Progress/Problems: Says she slept good last night for a change. Her goal is to stay calm and get her medications regulated.   Tamara Nguyen 01/03/2020, 1:29 PM

## 2020-01-03 NOTE — Progress Notes (Signed)
[facility] MD Progress Note  01/03/2020 12:22 PM Tamara Nguyen  MRN:  [id number] Subjective:   Principal Problem: <principal problem not specified> Diagnosis: Active Problems:   Bipolar I disorder, current or most recent episode manic, with psychotic features (HCC)  Total Time spent with patient: 30 minutes    Patient seen, chart reviewed, case discussed with treatment team. Patient reports a good night of sleep last night after first dose of Zyprexa medication. She states that she is feeling a little bit more clear although continues to have some anxiety. She took as needed lorazepam and Vistaril this morning but continues to have anxiety. She was offered 2.5 mg of Zyprexa and instructed that it would be added to her daily regimen in the mornings which patient expressed agreement. No side effects reported or observed. Patient is cooperative with care and attending groups. She continues to show a desire to get better and continue treatment.    Past Psychiatric History: See HPI  Past Medical History:  Past Medical History:  Diagnosis Date  . Major depressive disorder     Past Surgical History:  Procedure Laterality Date  . PANCREAS SURGERY     Family History: History reviewed. No pertinent family history. Family Psychiatric  History: See HPI Social History:  Social History   Substance and Sexual Activity  Alcohol Use No     Social History   Substance and Sexual Activity  Drug Use Yes  . Types: Methamphetamines    Social History   Socioeconomic History  . Marital status: Single    Spouse name: Not on file  . Number of children: Not on file  . Years of education: Not on file  . Highest education level: Not on file  Occupational History  . Not on file  Tobacco Use  . Smoking status: Current Every Day Smoker    Packs/day: 1.00    Types: Cigarettes  . Smokeless tobacco: Never Used  Vaping Use  . Vaping Use: Never used  Substance and Sexual Activity  . Alcohol use: No   . Drug use: Yes    Types: Methamphetamines  . Sexual activity: Not Currently  Other Topics Concern  . Not on file  Social History Narrative  . Not on file   Social Determinants of Health   Financial Resource Strain:   . Difficulty of Paying Living Expenses: Not on file  Food Insecurity:   . Worried About Programme researcher, broadcasting/film/video in the Last Year: Not on file  . Ran Out of Food in the Last Year: Not on file  Transportation Needs:   . Lack of Transportation (Medical): Not on file  . Lack of Transportation (Non-Medical): Not on file  Physical Activity:   . Days of Exercise per Week: Not on file  . Minutes of Exercise per Session: Not on file  Stress:   . Feeling of Stress : Not on file  Social Connections:   . Frequency of Communication with Friends and Family: Not on file  . Frequency of Social Gatherings with Friends and Family: Not on file  . Attends Religious Services: Not on file  . Active Member of Clubs or Organizations: Not on file  . Attends Banker Meetings: Not on file  . Marital Status: Not on file   Additional Social History:                         Sleep: Good  Appetite:  Good  Current  Medications: Current Facility-Administered Medications  Medication Dose Route Frequency Provider Last Rate Last Admin  . acetaminophen (TYLENOL) tablet 650 mg  650 mg Oral Q6H PRN Dixon, Rashaun M, NP      . alum & mag hydroxide-simeth (MAALOX/MYLANTA) 200-200-20 MG/5ML suspension 30 mL  30 mL Oral Q4H PRN Dixon, Rashaun M, NP      . benztropine (COGENTIN) tablet 1 mg  1 mg Oral BID PRN Aldean Baker, NP   1 mg at 01/02/20 1127   Or  . benztropine mesylate (COGENTIN) injection 1 mg  1 mg Intramuscular BID PRN Aldean Baker, NP      . haloperidol (HALDOL) tablet 10 mg  10 mg Oral BID PRN Aldean Baker, NP   10 mg at 01/02/20 1127   Or  . haloperidol lactate (HALDOL) injection 10 mg  10 mg Intramuscular BID PRN Aldean Baker, NP      . hydrOXYzine  (ATARAX/VISTARIL) tablet 25 mg  25 mg Oral TID PRN Clement Sayres, MD   25 mg at 01/03/20 0809  . lipase/protease/amylase (CREON) capsule 12,000 Units  12,000 Units Oral TID AC Aldean Baker, NP   12,000 Units at 01/03/20 1157  . LORazepam (ATIVAN) tablet 1 mg  1 mg Oral BID PRN Aldean Baker, NP   1 mg at 01/03/20 0809   Or  . LORazepam (ATIVAN) injection 1 mg  1 mg Intramuscular BID PRN Aldean Baker, NP      . magnesium hydroxide (MILK OF MAGNESIA) suspension 30 mL  30 mL Oral Daily PRN Dixon, Rashaun M, NP      . OLANZapine (ZYPREXA) tablet 2.5 mg  2.5 mg Oral Daily Faaris Arizpe, Worthy Rancher, MD   2.5 mg at 01/03/20 1157  . OLANZapine (ZYPREXA) tablet 5 mg  5 mg Oral QHS Arline Ketter, Worthy Rancher, MD   5 mg at 01/02/20 2121    Lab Results:  Results for orders placed or performed during the hospital encounter of 01/02/20 (from the past 48 hour(s))  Lipid panel     Status: Abnormal   Collection Time: 01/03/20  6:51 AM  Result Value Ref Range   Cholesterol 200 0 - 200 mg/dL   Triglycerides 093 (H) <150 mg/dL   HDL 46 >23 mg/dL   Total CHOL/HDL Ratio 4.3 RATIO   VLDL 30 0 - 40 mg/dL   LDL Cholesterol 557 (H) 0 - 99 mg/dL    Comment:        Total Cholesterol/HDL:CHD Risk Coronary Heart Disease Risk Table                     Men   Women  1/2 Average Risk   3.4   3.3  Average Risk       5.0   4.4  2 X Average Risk   9.6   7.1  3 X Average Risk  23.4   11.0        Use the calculated Patient Ratio above and the CHD Risk Table to determine the patient's CHD Risk.        ATP III CLASSIFICATION (LDL):  <100     mg/dL   Optimal  322-025  mg/dL   Near or Above                    Optimal  130-159  mg/dL   Borderline  427-062  mg/dL   High  >376     mg/dL  Very High Performed at [facility], [address], [place], [place] [address]     Blood Alcohol level:  Lab Results  Component Value Date   ETH <10 12/29/2019   ETH <10 06/30/2019    Metabolic Disorder  Labs: No results found for: HGBA1C, MPG No results found for: PROLACTIN Lab Results  Component Value Date   CHOL 200 01/03/2020   TRIG 152 (H) 01/03/2020   HDL 46 01/03/2020   CHOLHDL 4.3 01/03/2020   VLDL 30 01/03/2020   LDLCALC 124 (H) 01/03/2020    Physical Findings: AIMS: Facial and Oral Movements Muscles of Facial Expression: None, normal Lips and Perioral Area: None, normal Jaw: None, normal Tongue: None, normal,Extremity Movements Upper (arms, wrists, hands, fingers): None, normal Lower (legs, knees, ankles, toes): None, normal, Trunk Movements Neck, shoulders, hips: None, normal, Overall Severity Severity of abnormal movements (highest score from questions above): None, normal Incapacitation due to abnormal movements: None, normal Patient's awareness of abnormal movements (rate only patient's report): No Awareness, Dental Status Current problems with teeth and/or dentures?: No Does patient usually wear dentures?: No  CIWA:    COWS:     Musculoskeletal: Strength & Muscle Tone: within normal limits Gait & Station: normal Patient leans: N/A  Psychiatric Specialty Exam: Physical Exam  Review of Systems  All other systems reviewed and are negative.   Blood pressure 98/72, pulse 85, temperature 97.9 F (36.6 C), temperature source Oral, resp. rate 18, height 5\' 2"  (1.575 m), weight 54.4 kg, SpO2 100 %.Body mass index is 21.95 kg/m.  General Appearance: Fairly Groomed  Eye Contact:  Good  Speech:  Clear and Coherent and Pressured  Volume:  Normal  Mood:  Anxious  Affect:  Labile  Thought Process:  Coherent  Orientation:  Full (Time, Place, and Person)  Thought Content:  Logical  Suicidal Thoughts:  No  Homicidal Thoughts:  No  Memory:  Recent;   Fair  Judgement:  Fair  Insight:  Fair  Psychomotor Activity:  Normal  Concentration:  Concentration: Fair  Recall:  of Knowledge:  Fair  Language:  Fair  Akathisia:  No  Handed:  Right  AIMS (if  indicated):     Assets:  Communication Skills Desire for Improvement Resilience Social Support  ADL's:  Intact  Cognition:  WNL  Sleep:  Number of Hours: 6.75     Treatment Plan Summary: Daily contact with patient to assess and evaluate symptoms and progress in treatment    Assessment: Patient continues to have a display anxiety, although thoughts seem clear and more organized. Patient continues to deny any hallucinations or suicidal ideation at this time. She is compliant with medications and is reporting improvement. Patient continues to benefit from inpatient hospitalization for purposes of safety, stabilization, and medication management.  Plan:  We will continue with Zyprexa 5 mg p.o. nightly. We will add Zyprexa 2.5 mg p.o. every morning Continue with as needed lorazepam for anxiety Continue with as needed Vistaril for anxiety Continue to encourage group participation We will continue to monitor and observe patient Social work and discharge planning  [employer], MD 01/03/2020, 12:22 PM

## 2020-01-03 NOTE — [facility] Counselor (Signed)
CSW contacted [facility] [phone] to inquire on referral that was faxed by a previous facility. CSW left a message 01/03/20 at 2:03pm.   Fredirick Lathe, LCSWA Clinicial Social Worker [employer]

## 2020-01-03 NOTE — Progress Notes (Signed)
DAR NOTE: Patient presents with anxious affect and mood.  Denies suicidal thoughts, auditory and visual hallucinations.  Rates depression at 2, hopelessness at 0, and anxiety at 6.  Maintained on routine safety checks.  Medications given as prescribed.  Support and encouragement offered as needed.  Attended group and participated.  States goal for today is "to get through the day without being on the edge."  Patient visible in milieu briefly.  Ativan 1 mg and Vistaril given for agitation/anxiety with good effect.  Patient is safe on and off the unit.
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
## 2020-01-03 NOTE — Progress Notes (Signed)
   01/03/20 2022  Psych Admission Type (Psych Patients Only)  Admission Status Involuntary  Psychosocial Assessment  Patient Complaints Anxiety  Eye Contact Fair  Facial Expression Anxious;Pensive  Affect Anxious  Speech Logical/coherent  Interaction Assertive  Motor Activity Restless  Appearance/Hygiene Unremarkable  Behavior Characteristics Cooperative;Anxious  Mood Anxious  Thought Process  Coherency Concrete thinking  Content Blaming others;Blaming self  Delusions None reported or observed  Perception WDL  Hallucination None reported or observed  Judgment Poor  Confusion None  Danger to Self  Current suicidal ideation? Denies  Danger to Others  Danger to Others None reported or observed   Pt rates anxiety 4/10, stating, "I am just very anxious. That's how I always am." When asked what her goal for the day was, pt stated, "I am working on getting better and back on a medication regimen. I was taking my medication and seeing the doctor regularly but then I couldn't get a ride to the doctor and I stopped the meds when I ran out." Pt educated on the dangers of stopping psychiatric meds without tapering down.

## 2020-01-04 LAB — HEMOGLOBIN A1C
Hgb A1c MFr Bld: 5.4 % (ref 4.8–5.6)
Mean Plasma Glucose: 108 mg/dL

## 2020-01-04 MED ORDER — OLANZAPINE 5 MG PO TABS
5.0000 mg | ORAL_TABLET | Freq: Two times a day (BID) | ORAL | Status: DC
  Administered 2020-01-04 – 2020-01-05 (×2): 5 mg via ORAL
  Filled 2020-01-04 (×6): qty 1

## 2020-01-04 NOTE — Progress Notes (Signed)
   01/04/20 0600  Vital Signs  Temp 98 F (36.7 C)  Temp Source Oral  Pulse Rate 68  Resp 18  BP 98/72  BP Location Right Arm  BP Method Automatic  Patient Position (if appropriate) Sitting   Pt states that BP is always on the low side. Pt not complaining of any effects of this. Will continue to monitor.

## 2020-01-04 NOTE — Progress Notes (Signed)
Adult Psychoeducational Group Note  Date:  01/04/2020 Time:  12:57 AM  Group Topic/Focus:  Wrap-Up Group:   The focus of this group is to help patients review their daily goal of treatment and discuss progress on daily workbooks.  Participation Level:  Active  Participation Quality:  Appropriate  Affect:  Appropriate  Cognitive:  Appropriate  Insight: Appropriate  Engagement in Group:  Developing/Improving  Modes of Intervention:  Discussion  Additional Comments: Pt stated her goal for today was to focus on her treatment plan. Pt stated she felt she accomplished her goal today. Pt stated she talk with her doctor and social worker, regarding her care today. Pt stated she took all her medication today from her providers. Pt stated been able to contact her mother, daughter, and improved her overall day. Pt stated her relationship with her family and support team has improved since she was admitted here. Pt rated her overall day a 10 today. Pt stated she felt better about herself today. Pt stated her appetite was pretty good today and she attend all meals. Pt stated her sleep last night was good. Pt stated the goal for tonight was to get some rest. Pt stated she was in no physical pain. Pt deny auditory or visual hallucinations. Pt denies thoughts of harming herself or others. Pt stated she would alert staff if anything changes.     Tamara Nguyen 01/04/2020, 12:57 AM

## 2020-01-04 NOTE — Progress Notes (Signed)
[employer] MD Progress Note  01/04/2020 3:44 PM Tamara Nguyen  MRN:  [id number] Subjective:   Principal Problem: <principal problem not specified> Diagnosis: Active Problems:   Bipolar I disorder, current or most recent episode manic, with psychotic features (HCC)  Total Time spent with patient: 30 minutes    Patient seen, chart reviewed, case discussed with treatment team. Patient reports a good night of sleep and states that she is starting to feel better.  She is still experiencing some anxiety and was informed that her Zyprexa dose will be increased to 5 mg twice daily. No side effects reported or observed. Patient is cooperative with care and attending groups. She continues to show a desire to get better and continue treatment.    Past Psychiatric History: See HPI  Past Medical History:  Past Medical History:  Diagnosis Date  . Major depressive disorder     Past Surgical History:  Procedure Laterality Date  . PANCREAS SURGERY     Family History: History reviewed. No pertinent family history. Family Psychiatric  History: See HPI Social History:  Social History   Substance and Sexual Activity  Alcohol Use No     Social History   Substance and Sexual Activity  Drug Use Yes  . Types: Methamphetamines    Social History   Socioeconomic History  . Marital status: Single    Spouse name: Not on file  . Number of children: Not on file  . Years of education: Not on file  . Highest education level: Not on file  Occupational History  . Not on file  Tobacco Use  . Smoking status: Current Every Day Smoker    Packs/day: 1.00    Types: Cigarettes  . Smokeless tobacco: Never Used  Vaping Use  . Vaping Use: Never used  Substance and Sexual Activity  . Alcohol use: No  . Drug use: Yes    Types: Methamphetamines  . Sexual activity: Not Currently  Other Topics Concern  . Not on file  Social History Narrative  . Not on file   Social Determinants of Health   Financial  Resource Strain:   . Difficulty of Paying Living Expenses: Not on file  Food Insecurity:   . Worried About Programme researcher, broadcasting/film/video in the Last Year: Not on file  . Ran Out of Food in the Last Year: Not on file  Transportation Needs:   . Lack of Transportation (Medical): Not on file  . Lack of Transportation (Non-Medical): Not on file  Physical Activity:   . Days of Exercise per Week: Not on file  . Minutes of Exercise per Session: Not on file  Stress:   . Feeling of Stress : Not on file  Social Connections:   . Frequency of Communication with Friends and Family: Not on file  . Frequency of Social Gatherings with Friends and Family: Not on file  . Attends Religious Services: Not on file  . Active Member of Clubs or Organizations: Not on file  . Attends Banker Meetings: Not on file  . Marital Status: Not on file   Additional Social History:                         Sleep: Good  Appetite:  Good  Current Medications: Current Facility-Administered Medications  Medication Dose Route Frequency Provider Last Rate Last Admin  . acetaminophen (TYLENOL) tablet 650 mg  650 mg Oral Q6H PRN Jearld Lesch, NP      .  alum & mag hydroxide-simeth (MAALOX/MYLANTA) 200-200-20 MG/5ML suspension 30 mL  30 mL Oral Q4H PRN Dixon, Rashaun M, NP      . hydrOXYzine (ATARAX/VISTARIL) tablet 25 mg  25 mg Oral TID PRN Clement Sayres, MD   25 mg at 01/04/20 0821  . lipase/protease/amylase (CREON) capsule 12,000 Units  12,000 Units Oral TID AC Aldean Baker, NP   12,000 Units at 01/04/20 1136  . magnesium hydroxide (MILK OF MAGNESIA) suspension 30 mL  30 mL Oral Daily PRN Jearld Lesch, NP      . OLANZapine (ZYPREXA) tablet 2.5 mg  2.5 mg Oral Daily Shali Vesey, Worthy Rancher, MD   2.5 mg at 01/04/20 0821  . OLANZapine (ZYPREXA) tablet 5 mg  5 mg Oral QHS Reade Trefz, Worthy Rancher, MD   5 mg at 01/03/20 2035    Lab Results:  Results for orders placed or performed during the hospital encounter  of 01/02/20 (from the past 48 hour(s))  Hemoglobin A1c     Status: None   Collection Time: 01/03/20  6:51 AM  Result Value Ref Range   Hgb A1c MFr Bld 5.4 4.8 - 5.6 %    Comment: (NOTE)         Prediabetes: 5.7 - 6.4         Diabetes: >6.4         Glycemic control for adults with diabetes: <7.0    Mean Plasma Glucose 108 mg/dL    Comment: (NOTE) Performed At: [facility] [address] [place], [place] [id number] Jolene Schimke MD MV:[id number]   Lipid panel     Status: Abnormal   Collection Time: 01/03/20  6:51 AM  Result Value Ref Range   Cholesterol 200 0 - 200 mg/dL   Triglycerides 283 (H) <150 mg/dL   HDL 46 >66 mg/dL   Total CHOL/HDL Ratio 4.3 RATIO   VLDL 30 0 - 40 mg/dL   LDL Cholesterol 294 (H) 0 - 99 mg/dL    Comment:        Total Cholesterol/HDL:CHD Risk Coronary Heart Disease Risk Table                     Men   Women  1/2 Average Risk   3.4   3.3  Average Risk       5.0   4.4  2 X Average Risk   9.6   7.1  3 X Average Risk  23.4   11.0        Use the calculated Patient Ratio above and the CHD Risk Table to determine the patient's CHD Risk.        ATP III CLASSIFICATION (LDL):  <100     mg/dL   Optimal  765-465  mg/dL   Near or Above                    Optimal  130-159  mg/dL   Borderline  035-465  mg/dL   High  >681     mg/dL   Very High Performed at [facility], [address] [place], [place] [address]     Blood Alcohol level:  Lab Results  Component Value Date   [facility] <10 12/29/2019   ETH <10 06/30/2019    Metabolic Disorder Labs: Lab Results  Component Value Date   HGBA1C 5.4 01/03/2020   MPG 108 01/03/2020   No results found for: PROLACTIN Lab Results  Component Value Date   CHOL 200  01/03/2020   TRIG 152 (H) 01/03/2020   HDL 46 01/03/2020   CHOLHDL 4.3 01/03/2020   VLDL 30 01/03/2020   LDLCALC 124 (H) 01/03/2020    Physical Findings: AIMS: Facial and Oral Movements Muscles of Facial Expression:  None, normal Lips and Perioral Area: None, normal Jaw: None, normal Tongue: None, normal,Extremity Movements Upper (arms, wrists, hands, fingers): None, normal Lower (legs, knees, ankles, toes): None, normal, Trunk Movements Neck, shoulders, hips: None, normal, Overall Severity Severity of abnormal movements (highest score from questions above): None, normal Incapacitation due to abnormal movements: None, normal Patient's awareness of abnormal movements (rate only patient's report): No Awareness, Dental Status Current problems with teeth and/or dentures?: No Does patient usually wear dentures?: No  CIWA:    COWS:     Musculoskeletal: Strength & Muscle Tone: within normal limits Gait & Station: normal Patient leans: N/A  Psychiatric Specialty Exam: Physical Exam  Review of Systems  All other systems reviewed and are negative.   Blood pressure 106/73, pulse 94, temperature 98 F (36.7 C), temperature source Oral, resp. rate 18, height 5\' 2"  (1.575 m), weight 54.4 kg, SpO2 100 %.Body mass index is 21.95 kg/m.  General Appearance: Fairly Groomed  Eye Contact:  Good  Speech:  Clear and Coherent and Pressured  Volume:  Normal  Mood:  Anxious  Affect:  Labile  Thought Process:  Coherent  Orientation:  Full (Time, Place, and Person)  Thought Content:  Logical  Suicidal Thoughts:  No  Homicidal Thoughts:  No  Memory:  Recent;   Fair  Judgement:  Fair  Insight:  Fair  Psychomotor Activity:  Normal  Concentration:  Concentration: Fair  Recall:  of Knowledge:  Fair  Language:  Fair  Akathisia:  No  Handed:  Right  AIMS (if indicated):     Assets:  Communication Skills Desire for Improvement Resilience Social Support  ADL's:  Intact  Cognition:  WNL  Sleep:  Number of Hours: 6.25     Treatment Plan Summary: Daily contact with patient to assess and evaluate symptoms and progress in treatment    Assessment: Patient continues to have a display anxiety,  although thoughts seem clear and more organized. Patient continues to deny any hallucinations or suicidal ideation at this time. She is compliant with medications and is reporting improvement. Patient continues to benefit from inpatient hospitalization for purposes of safety, stabilization, and medication management.  Plan:  We will uptitrate to 5 mg Zyprexa twice daily. Continue with as needed lorazepam for anxiety Continue with as needed Vistaril for anxiety Continue to encourage group participation We will continue to monitor and observe patient Social work and discharge planning  [employer], MD 01/04/2020, 3:44 PMPatient ID: 01/06/2020, female   DOB: Jul 23, 1980, 39 y.o.   MRN: [id number]

## 2020-01-04 NOTE — [facility] Group Notes (Signed)
[facility] Group Notes:  (Nursing/MHT/Case Management/Adjunct)  Date:  01/04/2020  Time:  10:30 AM  Type of Therapy:  Group Therapy  Participation Level:  Active  Participation Quality:  Appropriate  Affect:  Appropriate  Cognitive:  Alert and Appropriate  Insight:  Appropriate and Good  Engagement in Group:  Engaged  Modes of Intervention:  Orientation  Summary of Progress/Problems: She has had a very positive stay here so far and has learned to deal with her anger by using her coping skills. She feels like it is a big help because it allows her to have a clear head for her thoughts and feelings to process. She is looking forward to seeing her kids and getting back on track with her life.   Tamara Nguyen J Shuree Brossart 01/04/2020, 10:30 AM

## 2020-01-04 NOTE — [facility] Group Notes (Signed)
[facility] LCSW Group Therapy  01/04/2020 2:51 PM  Type of Therapy:  Coping Skills  Participation Level:  Did Not Attend   Summary of Progress/Problems: This patient was invited to attend group, however this patient chose not to attend.    Erikah Thumm A Treanna Dumler 01/04/2020, 2:51 PM  

## 2020-01-04 NOTE — Progress Notes (Signed)
   01/04/20 1600  Psych Admission Type (Psych Patients Only)  Admission Status Involuntary  Psychosocial Assessment  Patient Complaints Anxiety  Eye Contact Fair  Facial Expression Anxious;Pensive  Affect Anxious  Speech Logical/coherent  Interaction Assertive  Motor Activity Restless  Appearance/Hygiene Unremarkable  Behavior Characteristics Cooperative  Mood Anxious  Thought Process  Coherency Concrete thinking  Content Blaming others;Blaming self  Delusions None reported or observed  Perception WDL  Hallucination None reported or observed  Judgment Poor  Confusion None  Danger to Self  Current suicidal ideation? Denies  Danger to Others  Danger to Others None reported or observed

## 2020-01-04 NOTE — Tx Team (Cosign Needed)
aysInterdisciplinary Treatment and Diagnostic Plan Update  01/04/2020 Time of Session: 10:07AM Tamara Nguyen MRN: [id number]  Principal Diagnosis: <principal problem not specified>  Secondary Diagnoses: Active Problems:   Bipolar I disorder, current or most recent episode manic, with psychotic features (HCC)   Current Medications:  Current Facility-Administered Medications  Medication Dose Route Frequency Provider Last Rate Last Admin  . acetaminophen (TYLENOL) tablet 650 mg  650 mg Oral Q6H PRN Dixon, Rashaun M, NP      . alum & mag hydroxide-simeth (MAALOX/MYLANTA) 200-200-20 MG/5ML suspension 30 mL  30 mL Oral Q4H PRN Dixon, Rashaun M, NP      . hydrOXYzine (ATARAX/VISTARIL) tablet 25 mg  25 mg Oral TID PRN Clement Sayres, MD   25 mg at 01/04/20 0821  . lipase/protease/amylase (CREON) capsule 12,000 Units  12,000 Units Oral TID AC Aldean Baker, NP   12,000 Units at 01/04/20 1136  . magnesium hydroxide (MILK OF MAGNESIA) suspension 30 mL  30 mL Oral Daily PRN Jearld Lesch, NP      . OLANZapine (ZYPREXA) tablet 2.5 mg  2.5 mg Oral Daily Cristofano, Worthy Rancher, MD   2.5 mg at 01/04/20 0821  . OLANZapine (ZYPREXA) tablet 5 mg  5 mg Oral QHS Cristofano, Worthy Rancher, MD   5 mg at 01/03/20 2035   PTA Medications: Medications Prior to Admission  Medication Sig Dispense Refill Last Dose  . busPIRone (BUSPAR) 15 MG tablet Take 15 mg by mouth 3 (three) times daily.     Marland Kitchen CREON 3000-9500 units CPEP Take 1 capsule by mouth 3 (three) times daily.     . hydrOXYzine (ATARAX/VISTARIL) 25 MG tablet Take 25 mg by mouth 2 (two) times daily.     . prochlorperazine (COMPAZINE) 10 MG tablet Take 10 mg by mouth every 8 (eight) hours as needed for nausea.       Patient Stressors: Financial difficulties Health problems Medication change or noncompliance Substance abuse  Patient Strengths: Ability for insight Average or above average intelligence Communication skills Motivation for  treatment/growth Supportive family/friends  Treatment Modalities: Medication Management, Group therapy, Case management,  1 to 1 session with clinician, Psychoeducation, Recreational therapy.   Physician Treatment Plan for Primary Diagnosis: <principal problem not specified> Long Term Goal(s): Improvement in symptoms so as ready for discharge   Short Term Goals: Ability to identify changes in lifestyle to reduce recurrence of condition will improve Ability to verbalize feelings will improve Ability to demonstrate self-control will improve Ability to identify and develop effective coping behaviors will improve Compliance with prescribed medications will improve  Medication Management: Evaluate patient's response, side effects, and tolerance of medication regimen.  Therapeutic Interventions: 1 to 1 sessions, Unit Group sessions and Medication administration.  Evaluation of Outcomes: Progressing  Physician Treatment Plan for Secondary Diagnosis: Active Problems:   Bipolar I disorder, current or most recent episode manic, with psychotic features (HCC)  Long Term Goal(s): Improvement in symptoms so as ready for discharge   Short Term Goals: Ability to identify changes in lifestyle to reduce recurrence of condition will improve Ability to verbalize feelings will improve Ability to demonstrate self-control will improve Ability to identify and develop effective coping behaviors will improve Compliance with prescribed medications will improve     Medication Management: Evaluate patient's response, side effects, and tolerance of medication regimen.  Therapeutic Interventions: 1 to 1 sessions, Unit Group sessions and Medication administration.  Evaluation of Outcomes: Progressing   RN Treatment Plan for Primary Diagnosis: <principal problem  not specified> Long Term Goal(s): Knowledge of disease and therapeutic regimen to maintain health will improve  Short Term Goals: Ability to  verbalize frustration and anger appropriately will improve, Ability to demonstrate self-control, Ability to verbalize feelings will improve and Compliance with prescribed medications will improve  Medication Management: RN will administer medications as ordered by provider, will assess and evaluate patient's response and provide education to patient for prescribed medication. RN will report any adverse and/or side effects to prescribing provider.  Therapeutic Interventions: 1 on 1 counseling sessions, Psychoeducation, Medication administration, Evaluate responses to treatment, Monitor vital signs and CBGs as ordered, Perform/monitor CIWA, COWS, AIMS and Fall Risk screenings as ordered, Perform wound care treatments as ordered.  Evaluation of Outcomes: Progressing   LCSW Treatment Plan for Primary Diagnosis: <principal problem not specified> Long Term Goal(s): Safe transition to appropriate next level of care at discharge, Engage patient in therapeutic group addressing interpersonal concerns.  Short Term Goals: Engage patient in aftercare planning with referrals and resources, Increase ability to appropriately verbalize feelings, Increase emotional regulation, Identify triggers associated with mental health/substance abuse issues and Increase skills for wellness and recovery  Therapeutic Interventions: Assess for all discharge needs, 1 to 1 time with Social worker, Explore available resources and support systems, Assess for adequacy in community support network, Educate family and significant other(s) on suicide prevention, Complete Psychosocial Assessment, Interpersonal group therapy.  Evaluation of Outcomes: Progressing   Progress in Treatment: Attending groups: No. Participating in groups: No. Taking medication as prescribed: No. Toleration medication: Yes. Family/Significant other contact made: Yes, individual(s) contacted:  Pt's mother was contacted by member of TOC team. Patient  understands diagnosis: Yes. Discussing patient identified problems/goals with staff: Yes. Medical problems stabilized or resolved: Yes. Denies suicidal/homicidal ideation: Yes. Issues/concerns per patient self-inventory: No. Other: None  New problem(s) identified: Yes, Describe:  Pt admitted for mood disregulation, medication non-compliance and meth use.  New Short Term/Long Term Goal(s): SW will encourage Pt to attend groups to learn new and transferable coping skills. SW will ensure Pt has appropriate follow-up services, prior to discharge.  Patient Goals: "I need the right meds"  Discharge Plan or Barriers: SW will continue to assess. Jeannetta Ellis has been contacted for follow-up services.  Reason for Continuation of Hospitalization: Aggression Anxiety Medication stabilization  Estimated Length of Stay: 3-5 Days  Attendees: Patient: Tamara Nguyen 01/04/2020 2:58 PM  Physician: Marciano Sequin, NP 01/04/2020 2:58 PM  Nursing:  01/04/2020 2:58 PM  RN Care Manager: 01/04/2020 2:58 PM  Social Worker: Jacinta Shoe, LCSW 01/04/2020 2:58 PM  Recreational Therapist:  01/04/2020 2:58 PM  Other:  01/04/2020 2:58 PM  Other:  01/04/2020 2:58 PM  Other: 01/04/2020 2:58 PM    Scribe for Treatment Team: Jacinta Shoe, LCSW 01/04/2020 2:58 PM

## 2020-01-05 ENCOUNTER — Encounter (HOSPITAL_COMMUNITY): Payer: Self-pay | Admitting: Internal Medicine

## 2020-01-05 MED ORDER — OLANZAPINE 5 MG PO TABS
5.0000 mg | ORAL_TABLET | Freq: Two times a day (BID) | ORAL | 0 refills | Status: DC
Start: 2020-01-05 — End: 2020-09-28

## 2020-01-05 MED ORDER — HYDROXYZINE HCL 25 MG PO TABS: 25.0000 mg | ORAL_TABLET | Freq: Three times a day (TID) | ORAL | 0 refills | Status: DC | PRN

## 2020-01-05 MED ORDER — TRAZODONE HCL 50 MG PO TABS: 50.0000 mg | ORAL_TABLET | Freq: Every evening | ORAL | 0 refills | Status: DC | PRN

## 2020-01-05 MED ORDER — TRAZODONE HCL 50 MG PO TABS
50.0000 mg | ORAL_TABLET | Freq: Every evening | ORAL | Status: DC | PRN
  Filled 2020-01-05: qty 1

## 2020-01-05 NOTE — Progress Notes (Signed)
D: Patient presents with flat affect. Patient reports they are experiencing some anxiety and rated it 4/10 (scale 0-10 used). Patient denies SI/HI at this time. Patient also denies AH/VH at this time. Patient aware to notify staff or RN if this changes.  A: Provided positive reinforcement and encouragement.  R: Patient cooperative and receptive to efforts.
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
## 2020-01-05 NOTE — Progress Notes (Signed)
Pt discharged to lobby. Pt was stable and appreciative at that time. All papers and prescriptions were given and valuables returned. Verbal understanding expressed. Denies SI/HI and A/VH. Pt given opportunity to express concerns and ask questions.  

## 2020-01-05 NOTE — Progress Notes (Signed)
Pt told [facility] that she needed something for sleep. Provider notified and received verbal order for Trazodone 50 mg QHS PRN.

## 2020-01-05 NOTE — Progress Notes (Signed)
  [facility] Adult Case Management Discharge Plan :  Will you be returning to the same living situation after discharge:  Yes,  to home At discharge, do you have transportation home?: Yes,  step-father to pick this patient up Do you have the ability to pay for your medications: Yes,  has medicaid  Release of information consent forms completed and in the chart;  Patient's signature needed at discharge.  Patient to Follow up at:  Follow-up Information    [facility] Follow up.   Contact information: [address] [place] [address] [phone]               Next level of care provider has access to [facility] Link:no  Safety Planning and Suicide Prevention discussed: Yes,  w/ husband     Has patient been referred to the Quitline?: Patient refused referral  Patient has been referred for addiction treatment: Pt. refused referral  Otelia Santee, LCSW 01/05/2020, 9:31 AM

## 2020-01-05 NOTE — [facility] Suicide Risk Assessment (Signed)
[facility] Discharge Suicide Risk Assessment   Principal Problem: <principal problem not specified> Discharge Diagnoses: Active Problems:   Bipolar I disorder, current or most recent episode manic, with psychotic features (HCC)   Total Time spent with patient: 30 minutes   Mental Status Per Nursing Assessment::   On Admission:  Thoughts of violence towards others, Plan to harm others  Demographic Factors:  Caucasian and Unemployed  Loss Factors: Loss of significant relationship and Financial problems/change in socioeconomic status  Historical Factors: Impulsivity  Risk Reduction Factors:   Sense of responsibility to family, Religious beliefs about death, Positive social support, Positive therapeutic relationship and Positive coping skills or problem solving skills  Continued Clinical Symptoms:  Previous Psychiatric Diagnoses and Treatments  Cognitive Features That Contribute To Risk:  None    Suicide Risk:  Minimal: No identifiable suicidal ideation.  Patients presenting with no risk factors but with morbid ruminations; may be classified as minimal risk based on the severity of the depressive symptoms   Follow-up Information    [facility] Call.   Why: A referral has been faxed to this provider at [phone].  Please follow-up with this provider to see if you qualify for ACTT services. Contact information: [address] [place] [address] [phone]               Plan Of Care/Follow-up recommendations:  Other:  Follow-up with outpatient psychiatric care  Clement Sayres, MD 01/05/2020, 10:07 AM

## 2020-04-27 ENCOUNTER — Ambulatory Visit: Admit: 2020-04-27 | Payer: MEDICAID

## 2020-05-02 ENCOUNTER — Ambulatory Visit
Admit: 2020-05-02 | Discharge: 2020-05-03 | Payer: MEDICAID | Attending: Student in an Organized Health Care Education/Training Program | Primary: Student in an Organized Health Care Education/Training Program

## 2020-05-02 DIAGNOSIS — F419 Anxiety disorder, unspecified: Principal | ICD-10-CM

## 2020-05-02 DIAGNOSIS — F3181 Bipolar II disorder: Principal | ICD-10-CM

## 2020-05-02 MED ORDER — FLUOXETINE 10 MG CAPSULE
ORAL_CAPSULE | Freq: Every day | ORAL | 1 refills | 30 days | Status: CP
Start: 2020-05-02 — End: 2020-07-01
  Filled 2020-05-02: qty 30, 30d supply, fill #0

## 2020-06-01 ENCOUNTER — Ambulatory Visit: Admit: 2020-06-01 | Payer: MEDICAID

## 2020-09-28 ENCOUNTER — Emergency Department
Admission: EM | Admit: 2020-09-28 | Discharge: 2020-09-28 | Disposition: A | Discharge status: Home / Self Care | Payer: No Typology Code available for payment source | Attending: Emergency Medicine | Admitting: Emergency Medicine

## 2020-09-28 ENCOUNTER — Other Ambulatory Visit: Payer: Self-pay

## 2020-09-28 DIAGNOSIS — F3132 Bipolar disorder, current episode depressed, moderate: Secondary | ICD-10-CM

## 2020-09-28 DIAGNOSIS — Z79899 Other long term (current) drug therapy: Secondary | ICD-10-CM | POA: Insufficient documentation

## 2020-09-28 DIAGNOSIS — F15950 Other stimulant use, unspecified with stimulant-induced psychotic disorder with delusions: Secondary | ICD-10-CM | POA: Diagnosis not present

## 2020-09-28 DIAGNOSIS — F1590 Other stimulant use, unspecified, uncomplicated: Secondary | ICD-10-CM | POA: Diagnosis not present

## 2020-09-28 DIAGNOSIS — F17210 Nicotine dependence, cigarettes, uncomplicated: Secondary | ICD-10-CM | POA: Diagnosis not present

## 2020-09-28 DIAGNOSIS — F3130 Bipolar disorder, current episode depressed, mild or moderate severity, unspecified: Secondary | ICD-10-CM | POA: Diagnosis not present

## 2020-09-28 DIAGNOSIS — Z20822 Contact with and (suspected) exposure to covid-19: Secondary | ICD-10-CM | POA: Diagnosis not present

## 2020-09-28 DIAGNOSIS — Y900 Blood alcohol level of less than 20 mg/100 ml: Secondary | ICD-10-CM | POA: Insufficient documentation

## 2020-09-28 DIAGNOSIS — F323 Major depressive disorder, single episode, severe with psychotic features: Secondary | ICD-10-CM | POA: Diagnosis present

## 2020-09-28 DIAGNOSIS — F312 Bipolar disorder, current episode manic severe with psychotic features: Secondary | ICD-10-CM | POA: Diagnosis present

## 2020-09-28 DIAGNOSIS — Z046 Encounter for general psychiatric examination, requested by authority: Secondary | ICD-10-CM | POA: Diagnosis present

## 2020-09-28 DIAGNOSIS — F1510 Other stimulant abuse, uncomplicated: Secondary | ICD-10-CM | POA: Diagnosis present

## 2020-09-28 LAB — URINALYSIS, COMPLETE (UACMP) WITH MICROSCOPIC
Bilirubin Urine: NEGATIVE
Glucose, UA: NEGATIVE mg/dL
Hgb urine dipstick: NEGATIVE
Ketones, ur: 5 mg/dL — AB
Nitrite: POSITIVE — AB
Protein, ur: NEGATIVE mg/dL
Specific Gravity, Urine: 1.021 (ref 1.005–1.030)
pH: 6 (ref 5.0–8.0)

## 2020-09-28 LAB — COMPREHENSIVE METABOLIC PANEL
ALT: 10 U/L (ref 0–44)
AST: 17 U/L (ref 15–41)
Albumin: 3.8 g/dL (ref 3.5–5.0)
Alkaline Phosphatase: 70 U/L (ref 38–126)
Anion gap: 5 (ref 5–15)
BUN: 10 mg/dL (ref 6–20)
CO2: 28 mmol/L (ref 22–32)
Calcium: 8.7 mg/dL — ABNORMAL LOW (ref 8.9–10.3)
Chloride: 106 mmol/L (ref 98–111)
Creatinine, Ser: 0.74 mg/dL (ref 0.44–1.00)
GFR, Estimated: >60 mL/min (ref >60)
Glucose, Bld: 136 mg/dL — ABNORMAL HIGH (ref 70–99)
Potassium: 4.1 mmol/L (ref 3.5–5.1)
Sodium: 139 mmol/L (ref 135–145)
Total Bilirubin: 0.5 mg/dL (ref 0.3–1.2)
Total Protein: 7 g/dL (ref 6.5–8.1)

## 2020-09-28 LAB — URINE DRUG SCREEN, QUALITATIVE (ARMC ONLY)
Amphetamines, Ur Screen: POSITIVE — AB
Barbiturates, Ur Screen: NOT DETECTED
Benzodiazepine, Ur Scrn: NOT DETECTED
Cannabinoid 50 Ng, Ur ~~LOC~~: NOT DETECTED
Cocaine Metabolite,Ur ~~LOC~~: NOT DETECTED
MDMA (Ecstasy)Ur Screen: NOT DETECTED
Methadone Scn, Ur: NOT DETECTED
Opiate, Ur Screen: NOT DETECTED
Phencyclidine (PCP) Ur S: NOT DETECTED
Tricyclic, Ur Screen: NOT DETECTED

## 2020-09-28 LAB — CBC
HCT: 41.3 % (ref 36.0–46.0)
Hemoglobin: 13.9 g/dL (ref 12.0–15.0)
MCH: 33.3 pg (ref 26.0–34.0)
MCHC: 33.7 g/dL (ref 30.0–36.0)
MCV: 99 fL (ref 80.0–100.0)
Platelets: 457 10*3/uL — ABNORMAL HIGH (ref 150–400)
RBC: 4.17 MIL/uL (ref 3.87–5.11)
RDW: 14.4 % (ref 11.5–15.5)
WBC: 10.6 10*3/uL — ABNORMAL HIGH (ref 4.0–10.5)
nRBC: 0 % (ref 0.0–0.2)

## 2020-09-28 LAB — RESP PANEL BY RT-PCR (FLU A&B, COVID) ARPGX2
Influenza A by PCR: NEGATIVE
Influenza B by PCR: NEGATIVE
SARS Coronavirus 2 by RT PCR: NEGATIVE

## 2020-09-28 LAB — ETHANOL: Alcohol, Ethyl (B): <10 mg/dL (ref <10)

## 2020-09-28 LAB — HCG, QUANTITATIVE, PREGNANCY: hCG, Beta Chain, Quant, S: 1 m[IU]/mL (ref <5)

## 2020-09-28 MED ORDER — FOSFOMYCIN TROMETHAMINE 3 G PO PACK
3.0000 g | PACK | Freq: Once | ORAL | Status: AC
  Administered 2020-09-28: 3 g via ORAL
  Filled 2020-09-28: qty 3

## 2020-09-28 NOTE — ED Notes (Signed)
Patient transferred from Triage to room [facility] after dressing out and screening for contraband. Report received from Raquel RN including situation, background, assessment and recommendations. Pt oriented to [employer] including Q15 minute rounds as well as Psychologist, counselling for their protection. Patient is alert and oriented, warm and dry in no acute distress. Patient denies SI, HI, and AVH. Pt. Encouraged to let this nurse know if needs arise.
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
## 2020-09-28 NOTE — Consult Note (Signed)
[facility] Face-to-Face Psychiatry Consult   Reason for Consult: Consult for 40 year old woman with history of amphetamine induced psychotic symptoms Referring Physician: Katrinka Blazing Patient Identification: Tamara Nguyen MRN:  [id number] Principal Diagnosis: Amphetamine and psychostimulant-induced psychosis with delusions (HCC) Diagnosis:  Principal Problem:   Amphetamine and psychostimulant-induced psychosis with delusions (HCC) Active Problems:   Amphetamine abuse (HCC)   Major depression with psychotic features (HCC)   Bipolar I disorder, current or most recent episode manic, with psychotic features (HCC)   Total Time spent with patient: 1 hour  Subjective:   Tamara Nguyen is a 40 y.o. female patient admitted with "there is nothing wrong"  .  HPI: Patient seen chart reviewed.  Patient tells me that she is only here because she and her daughter got into an argument yesterday.  Patient lives with her husband and has 2 daughters ages 34 and 46.  She says one of them got mad at her and wanted her to "go to rehab".  Patient says she does not want to do that and does not think she has a substance abuse problem.  Commitment paperwork indicated that the patient was hallucinating acting bizarrely and agitated again.  Patient minimizes drug use admitting that she does use meth but saying she uses it very rarely only once or twice a week.  Patient says she only uses it because it helps her to stay awake.  She says that she spends most of her time at home not doing much but she denies being depressed denies suicidal or homicidal thoughts denies any hallucinations.  Denies alcohol abuse.  Past Psychiatric History: Multiple presentations in the past with psychotic symptoms which seem to always be associated with amphetamine abuse and to clear up once she is off of drugs.  Diagnosis of bipolar has been obtained in the past but patient has never been compliant regularly with outpatient treatment  Risk to Self:    Risk to Others:   Prior Inpatient Therapy:   Prior Outpatient Therapy:    Past Medical History:  Past Medical History:  Diagnosis Date   Major depressive disorder     Past Surgical History:  Procedure Laterality Date   PANCREAS SURGERY     Family History: No family history on file. Family Psychiatric  History: See previous Social History:  Social History   Substance and Sexual Activity  Alcohol Use No     Social History   Substance and Sexual Activity  Drug Use Yes   Types: Methamphetamines    Social History   Socioeconomic History   Marital status: Single    Spouse name: Not on file   Number of children: Not on file   Years of education: Not on file   Highest education level: Not on file  Occupational History   Not on file  Tobacco Use   Smoking status: Every Day    Packs/day: 1.00    Types: Cigarettes   Smokeless tobacco: Never  Vaping Use   Vaping Use: Never used  Substance and Sexual Activity   Alcohol use: No   Drug use: Yes    Types: Methamphetamines   Sexual activity: Not Currently  Other Topics Concern   Not on file  Social History Narrative   Not on file   Social Determinants of Health   Financial Resource Strain: Not on file  Food Insecurity: Not on file  Transportation Needs: Not on file  Physical Activity: Not on file  Stress: Not on file  Social Connections:  Not on file   Additional Social History:    Allergies:  No Known Allergies  Labs:  Results for orders placed or performed during the hospital encounter of 09/28/20 (from the past 48 hour(s))  CBC     Status: Abnormal   Collection Time: 09/28/20 12:14 AM  Result Value Ref Range   WBC 10.6 (H) 4.0 - 10.5 K/uL   RBC 4.17 3.87 - 5.11 MIL/uL   Hemoglobin 13.9 12.0 - 15.0 g/dL   HCT 75.7 97.2 - 82.0 %   MCV 99.0 80.0 - 100.0 fL   MCH 33.3 26.0 - 34.0 pg   MCHC 33.7 30.0 - 36.0 g/dL   RDW 60.1 56.1 - 53.7 %   Platelets 457 (H) 150 - 400 K/uL   nRBC 0.0 0.0 - 0.2 %     Comment: Performed at [facility], [address] [place], [place] [address]  Comprehensive metabolic panel     Status: Abnormal   Collection Time: 09/28/20 12:14 AM  Result Value Ref Range   Sodium 139 135 - 145 mmol/L   Potassium 4.1 3.5 - 5.1 mmol/L   Chloride 106 98 - 111 mmol/L   CO2 28 22 - 32 mmol/L   Glucose, Bld 136 (H) 70 - 99 mg/dL    Comment: Glucose reference range applies only to samples taken after fasting for at least 8 hours.   BUN 10 6 - 20 mg/dL   Creatinine, Ser 6.14 0.44 - 1.00 mg/dL   Calcium 8.7 (L) 8.9 - 10.3 mg/dL   Total Protein 7.0 6.5 - 8.1 g/dL   Albumin 3.8 3.5 - 5.0 g/dL   AST 17 15 - 41 U/L   ALT 10 0 - 44 U/L   Alkaline Phosphatase 70 38 - 126 U/L   Total Bilirubin 0.5 0.3 - 1.2 mg/dL   GFR, Estimated >70 >92 mL/min    Comment: (NOTE) Calculated using the CKD-EPI Creatinine Equation (2021)    Anion gap 5 5 - 15    Comment: Performed at [facility], [address] [place], [place] [address]  Ethanol     Status: None   Collection Time: 09/28/20 12:14 AM  Result Value Ref Range   Alcohol, Ethyl (B) <10 <10 mg/dL    Comment: (NOTE) Lowest detectable limit for serum alcohol is 10 mg/dL.  For medical purposes only. Performed at [facility], [address] [place], [place] [address]   Resp Panel by RT-PCR (Flu A&B, Covid) Nasopharyngeal Swab     Status: None   Collection Time: 09/28/20 12:23 AM   Specimen: Nasopharyngeal Swab; Nasopharyngeal(NP) swabs in vial transport medium  Result Value Ref Range   SARS Coronavirus 2 by RT PCR NEGATIVE NEGATIVE    Comment: (NOTE) SARS-CoV-2 target nucleic acids are NOT DETECTED.  The SARS-CoV-2 RNA is generally detectable in upper respiratory specimens during the acute phase of infection. The lowest concentration of SARS-CoV-2 viral copies this assay can detect is 138 copies/mL. A negative result does not preclude SARS-Cov-2 infection and should not be used as the sole  basis for treatment or other patient management decisions. A negative result may occur with  improper specimen collection/handling, submission of specimen other than nasopharyngeal swab, presence of viral mutation(s) within the areas targeted by this assay, and inadequate number of viral copies(<138 copies/mL). A negative result must be combined with clinical observations, patient history, and epidemiological information. The expected result is Negative.  Fact Sheet for Patients:  BloggerCourse.com  Fact Sheet  for Healthcare Providers:  SeriousBroker.ithttps://www.fda.gov/media/152162/download  This test is no t yet approved or cleared by the [place] FDA and  has been authorized for detection and/or diagnosis of SARS-CoV-2 by FDA under an Emergency Use Authorization (EUA). This EUA will remain  in effect (meaning this test can be used) for the duration of the COVID-19 declaration under Section 564(b)(1) of the Act, 21 U.S.C.section 360bbb-3(b)(1), unless the authorization is terminated  or revoked sooner.       Influenza A by PCR NEGATIVE NEGATIVE   Influenza B by PCR NEGATIVE NEGATIVE    Comment: (NOTE) The Xpert Xpress SARS-CoV-2/FLU/RSV plus assay is intended as an aid in the diagnosis of influenza from Nasopharyngeal swab specimens and should not be used as a sole basis for treatment. Nasal washings and aspirates are unacceptable for Xpert Xpress SARS-CoV-2/FLU/RSV testing.  Fact Sheet for Patients: BloggerCourse.comhttps://www.fda.gov/media/152166/download  Fact Sheet for Healthcare Providers: SeriousBroker.ithttps://www.fda.gov/media/152162/download  This test is not yet approved or cleared by the [place] FDA and has been authorized for detection and/or diagnosis of SARS-CoV-2 by FDA under an Emergency Use Authorization (EUA). This EUA will remain in effect (meaning this test can be used) for the duration of the COVID-19 declaration under Section 564(b)(1) of the Act, 21  U.S.C. section 360bbb-3(b)(1), unless the authorization is terminated or revoked.  Performed at [facility], [address] [place], [place] [address]   Urinalysis, Complete w Microscopic     Status: Abnormal   Collection Time: 09/28/20  2:15 AM  Result Value Ref Range   Color, Urine AMBER (A) YELLOW    Comment: BIOCHEMICALS MAY BE AFFECTED BY COLOR   APPearance CLOUDY (A) CLEAR   Specific Gravity, Urine 1.021 1.005 - 1.030   pH 6.0 5.0 - 8.0   Glucose, UA NEGATIVE NEGATIVE mg/dL   Hgb urine dipstick NEGATIVE NEGATIVE   Bilirubin Urine NEGATIVE NEGATIVE   Ketones, ur 5 (A) NEGATIVE mg/dL   Protein, ur NEGATIVE NEGATIVE mg/dL   Nitrite POSITIVE (A) NEGATIVE   Leukocytes,Ua TRACE (A) NEGATIVE   RBC / HPF 21-50 0 - 5 RBC/hpf   WBC, UA 21-50 0 - 5 WBC/hpf   Bacteria, UA RARE (A) NONE SEEN   Squamous Epithelial / LPF 0-5 0 - 5   Mucus PRESENT    Ca Oxalate Crys, UA PRESENT     Comment: Performed at [facility], [address] [place], [place] [address]  Urine Drug Screen, Qualitative ([facility] only)     Status: Abnormal   Collection Time: 09/28/20  2:15 AM  Result Value Ref Range   Tricyclic, Ur Screen NONE DETECTED NONE DETECTED   Amphetamines, Ur Screen POSITIVE (A) NONE DETECTED   MDMA (Ecstasy)Ur Screen NONE DETECTED NONE DETECTED   Cocaine Metabolite,Ur [place] NONE DETECTED NONE DETECTED   Opiate, Ur Screen NONE DETECTED NONE DETECTED   Phencyclidine (PCP) Ur S NONE DETECTED NONE DETECTED   Cannabinoid 50 Ng, Ur [place] NONE DETECTED NONE DETECTED   Barbiturates, Ur Screen NONE DETECTED NONE DETECTED   Benzodiazepine, Ur Scrn NONE DETECTED NONE DETECTED   Methadone Scn, Ur NONE DETECTED NONE DETECTED    Comment: (NOTE) Tricyclics + metabolites, urine    Cutoff 1000 ng/mL Amphetamines + metabolites, urine  Cutoff 1000 ng/mL MDMA (Ecstasy), urine              Cutoff 500 ng/mL Cocaine Metabolite, urine          Cutoff 300 ng/mL Opiate + metabolites, urine  Cutoff 300 ng/mL Phencyclidine (PCP), urine         Cutoff 25 ng/mL Cannabinoid, urine                 Cutoff 50 ng/mL Barbiturates + metabolites, urine  Cutoff 200 ng/mL Benzodiazepine, urine              Cutoff 200 ng/mL Methadone, urine                   Cutoff 300 ng/mL  The urine drug screen provides only a preliminary, unconfirmed analytical test result and should not be used for non-medical purposes. Clinical consideration and professional judgment should be applied to any positive drug screen result due to possible interfering substances. A more specific alternate chemical method must be used in order to obtain a confirmed analytical result. Gas chromatography / mass spectrometry (GC/MS) is the preferred confirm atory method. Performed at [facility], [address] [place], [place] [address]   hCG, quantitative, pregnancy     Status: None   Collection Time: 09/28/20  2:15 AM  Result Value Ref Range   hCG, Beta Chain, Quant, S 1 <5 mIU/mL    Comment:          GEST. AGE      CONC.  (mIU/mL)   <=1 WEEK        5 - 50     2 WEEKS       50 - 500     3 WEEKS       100 - 10,000     4 WEEKS     1,000 - 30,000     5 WEEKS     3,500 - 115,000   6-8 WEEKS     12,000 - 270,000    12 WEEKS     15,000 - 220,000        FEMALE AND NON-PREGNANT FEMALE:     LESS THAN 5 mIU/mL Performed at [facility], [address] [place], [place] [address]     No current facility-administered medications for this encounter.   Current Outpatient Medications  Medication Sig Dispense Refill   FLUoxetine (PROZAC) 10 MG capsule Take 1 capsule by mouth daily.     hydrOXYzine (ATARAX/VISTARIL) 10 MG tablet Take 1 tablet by mouth 3 (three) times daily as needed.     traZODone (DESYREL) 50 MG tablet Take 1 tablet (50 mg total) by mouth at bedtime as needed for sleep. 30 tablet 0   CREON 3000-9500 units CPEP Take 1 capsule by mouth 3 (three) times daily. (Patient not taking: Reported  on 09/28/2020)      Musculoskeletal: Strength & Muscle Tone: within normal limits Gait & Station: normal Patient leans: N/A            Psychiatric Specialty Exam:  Presentation  General Appearance: Bizarre  Eye Contact:Fleeting  Speech:Clear and Coherent  Speech Volume:Normal  Handedness:Right   Mood and Affect  Mood:Irritable; Anxious  Affect:Blunt; Congruent; Flat   Thought Process  Thought Processes:Coherent  Descriptions of Associations:Circumstantial  Orientation:Full (Time, Place and Person)  Thought Content:Logical; Tangential  History of Schizophrenia/Schizoaffective disorder:No  Duration of Psychotic Symptoms:No data recorded Hallucinations:Hallucinations: None  Ideas of Reference:None  Suicidal Thoughts:Suicidal Thoughts: No  Homicidal Thoughts:Homicidal Thoughts: No   Sensorium  Memory:Immediate Good; Recent Good; Remote Good  Judgment:Fair  Insight:Poor   Executive Functions  Concentration:Good  Attention Span:Good  Recall:Good  Fund of Knowledge:Fair  Language:Good   Psychomotor Activity  Psychomotor Activity:Psychomotor Activity: Normal   Assets  Assets:Communication Skills; Financial Resources/Insurance; Social Support   Sleep  Sleep:Sleep: Good   Physical Exam: Physical Exam Vitals and nursing note reviewed.  Constitutional:      Appearance: Normal appearance.  HENT:     Head: Normocephalic and atraumatic.     Mouth/Throat:     Pharynx: Oropharynx is clear.  Eyes:     Pupils: Pupils are equal, round, and reactive to light.  Cardiovascular:     Rate and Rhythm: Normal rate and regular rhythm.  Pulmonary:     Effort: Pulmonary effort is normal.     Breath sounds: Normal breath sounds.  Abdominal:     General: Abdomen is flat.     Palpations: Abdomen is soft.  Musculoskeletal:        General: Normal range of motion.  Skin:    General: Skin is warm and dry.  Neurological:     General: No  focal deficit present.     Mental Status: She is alert. Mental status is at baseline.  Psychiatric:        Attention and Perception: Attention normal.        Mood and Affect: Mood normal.        Speech: Speech normal.        Behavior: Behavior is cooperative.        Thought Content: Thought content normal.        Cognition and Memory: Memory is impaired.        Judgment: Judgment is inappropriate.   Review of Systems  Constitutional: Negative.   HENT: Negative.    Eyes: Negative.   Respiratory: Negative.    Cardiovascular: Negative.   Gastrointestinal: Negative.   Musculoskeletal: Negative.   Skin: Negative.   Neurological: Negative.   Psychiatric/Behavioral: Negative.    Blood pressure 116/79, pulse 80, temperature (!) 97.5 F (36.4 C), resp. rate 18, height 5\' 1"  (1.549 m), weight 54.4 kg, last menstrual period 09/21/2020, SpO2 99 %. Body mass index is 22.67 kg/m.  Treatment Plan Summary: Plan patient currently is back to her baseline without any indication of psychosis.  Denies suicidal or homicidal thought.  Not displaying any agitated or aggressive behavior.  No longer meets commitment criteria.  Patient has been counseled at length about the dangers of methamphetamine abuse and the obvious problems that is causing her over the years.  She dismisses this and says she does not plan to follow up with outpatient treatment.  Discontinue IVC.  Case reviewed with ER physician  Disposition: Patient does not meet criteria for psychiatric inpatient admission.  11/22/2020, MD 09/28/2020 6:39 PM

## 2020-09-28 NOTE — ED Notes (Signed)
Noted that lab had urine in process for pt despite pt not providing sample since arriving. Lab contacted to get this stopped. Staets they will fix it on their end. Pt continues to need to provide sample.

## 2020-09-28 NOTE — ED Notes (Signed)
Pt to [facility], report to [place], Charity fundraiser

## 2020-09-28 NOTE — ED Provider Notes (Signed)
Emergency Medicine Observation Re-evaluation Note  Tamara Nguyen is a 40 y.o. female, seen on rounds today.  Pt initially presented to the ED for complaints of Psychiatric Evaluation Currently, the patient is resting comfortably..  Physical Exam  BP 113/82 (BP Location: Left Arm)   Pulse 72   Temp 97.9 F (36.6 C) (Oral)   Resp 20   Ht 5\' 1"  (1.549 m)   Wt 54.4 kg   LMP 09/21/2020 (Exact Date)   SpO2 100%   BMI 22.67 kg/m  Physical Exam Constitutional:      Appearance: She is not ill-appearing or toxic-appearing.  HENT:     Head: Atraumatic.  Cardiovascular:     Comments: Appears well perfused Pulmonary:     Effort: Pulmonary effort is normal.  Abdominal:     General: There is no distension.  Musculoskeletal:        General: No deformity.  Neurological:     General: No focal deficit present.     ED Course / MDM  EKG:   I have reviewed the labs performed to date as well as medications administered while in observation.  Recent changes in the last 24 hours include Evaluation by overnight psychiatric nurse practitioner who recommends morning reassessment..  Plan  Current plan is for AM reassessment. Patient is under full IVC at this time.   11/22/2020, MD 09/28/20 [phone]

## 2020-09-28 NOTE — ED Notes (Signed)
Pt reports she is not sure why she is here other than her family. States they cannot charge her with anything so they IVC'd her instead. Pt is denying SI, HI, AVH, no complaints. States she has been here before and understands process. Provided with blanket

## 2020-09-28 NOTE — ED Notes (Signed)
Hourly rounding performed, patient currently awake in hallway bed. Patient has no complaints at this time. Q15 minute rounds and monitoring via Rover and Officer to continue. 

## 2020-09-28 NOTE — ED Triage Notes (Signed)
Pt brought in under IVC pt denies any SI or HI.
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
## 2020-09-28 NOTE — ED Notes (Signed)
Hourly rounding performed, patient currently asleep in halllway bed. Patient has no complaints at this time. Q15 minute rounds and monitoring via Psychologist, counselling to continue.

## 2020-09-28 NOTE — ED Provider Notes (Signed)
[facility] Emergency Department Provider Note   ____________________________________________   Event Date/Time   First MD Initiated Contact with Patient 09/28/20 0019     (approximate)  I have reviewed the triage vital signs and the nursing notes.   HISTORY  Chief Complaint Psychiatric Evaluation    HPI Tamara Nguyen is a 40 y.o. female brought to the ED under IVC for substance use and hallucinating.  Patient denies active SI/HI/AH/VH.  Voices no medical complaints.  States she is prescribed medications but has not taken any medications for several months.     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 Authorizing Provider  FLUoxetine (PROZAC) 10 MG capsule Take 1 capsule by mouth daily. 05/02/20  Yes [provider]  hydrOXYzine (ATARAX/VISTARIL) 10 MG tablet Take 1 tablet by mouth 3 (three) times daily as needed.   Yes [provider]  traZODone (DESYREL) 50 MG tablet Take 1 tablet (50 mg total) by mouth at bedtime as needed for sleep. 01/05/20  Yes Rankin, Shuvon B, NP  CREON 3000-9500 units CPEP Take 1 capsule by mouth 3 (three) times daily. Patient not taking: Reported on 09/28/2020 12/15/19   [provider]    Allergies Patient has no known allergies.  No family history on file.  Social History Social History   Tobacco Use   Smoking status: Every Day    Packs/day: 1.00    Types: Cigarettes    Smokeless tobacco: Never  Vaping Use   Vaping Use: Never used  Substance Use Topics   Alcohol use: No   Drug use: Yes    Types: Methamphetamines    Review of Systems  Constitutional: No fever/chills Eyes: No visual changes. ENT: No sore throat. Cardiovascular: Denies chest pain. Respiratory: Denies shortness of breath. Gastrointestinal: No abdominal pain.  No nausea, no vomiting.  No diarrhea.  No constipation. Genitourinary: Negative for dysuria. Musculoskeletal: Negative for back pain. Skin: Negative for rash. Neurological: Negative for headaches, focal weakness or numbness. Psychiatric: Positive for depression.  ____________________________________________   PHYSICAL EXAM:  VITAL SIGNS: ED Triage Vitals [09/28/20 0009]  Enc Vitals Group     BP 113/82     Pulse Rate 72     Resp 20     Temp 97.9 F (36.6 C)     Temp Source Oral     SpO2 100 %     Weight 120 lb (54.4 kg)     Height 5\' 1"  (1.549 m)     Head Circumference      Peak Flow      Pain Score 0     Pain Loc      Pain Edu?      Excl. in GC?     Constitutional: Alert and oriented. Well appearing and in no acute distress. Eyes: Conjunctivae are normal. PERRL. EOMI. Head: Atraumatic. Nose: No congestion/rhinnorhea. Mouth/Throat: Mucous membranes are moist.   Neck: No stridor.   Cardiovascular: Normal rate, regular rhythm.  Grossly normal heart sounds.  Good peripheral circulation. Respiratory: Normal respiratory effort.  No retractions. Lungs CTAB. Gastrointestinal: Soft and nontender. No distention. No abdominal bruits. No CVA tenderness. Musculoskeletal: No lower extremity tenderness nor edema.  No joint effusions. Neurologic:  Normal speech and language. No gross focal neurologic deficits are appreciated. No gait instability. Skin:  Skin is warm, dry and intact. No rash noted. Psychiatric: Mood and affect are normal. Speech and behavior are normal.  ____________________________________________    LABS (all labs ordered are listed, but only abnormal results are displayed)  Labs Reviewed  CBC - Abnormal; Notable for the following components:      Result Value   WBC 10.6 (*)    Platelets 457 (*)    All other components within normal limits  COMPREHENSIVE METABOLIC PANEL - Abnormal; Notable for the following components:   Glucose, Bld 136 (*)    Calcium 8.7 (*)    All other components within normal limits  URINALYSIS, COMPLETE (UACMP) WITH MICROSCOPIC - Abnormal; Notable for the following components:   Color, Urine AMBER (*)    APPearance CLOUDY (*)    Ketones, ur 5 (*)    Nitrite POSITIVE (*)    Leukocytes,Ua TRACE (*)    Bacteria, UA RARE (*)    All other components within normal limits  URINE DRUG SCREEN, QUALITATIVE ([facility] ONLY) - Abnormal; Notable for the following components:   Amphetamines, Ur Screen POSITIVE (*)    All other components within normal limits  RESP PANEL BY RT-PCR (FLU A&B, COVID) ARPGX2  ETHANOL  HCG, QUANTITATIVE, PREGNANCY   ____________________________________________  EKG  None ____________________________________________  RADIOLOGY I, Jancy Sprankle J, personally viewed and evaluated these images (plain radiographs) as part of my medical decision making, as well as reviewing the written report by the radiologist.  ED MD interpretation: None  Official radiology report(s): No results found.  ____________________________________________   PROCEDURES  Procedure(s) performed (including Critical Care):  Procedures   ____________________________________________   INITIAL IMPRESSION / ASSESSMENT AND PLAN / ED COURSE  As part of my medical decision making, I reviewed the following data within the electronic MEDICAL RECORD NUMBER Nursing notes reviewed and incorporated, Labs reviewed, Old chart reviewed, A consult was requested and obtained from this/these consultant(s) Psychiatry, and Notes from prior ED visits     40 year old female with  bipolar disorder brought to the ED under IVC. The patient has been placed in psychiatric observation due to the need to provide a safe environment for the patient while obtaining psychiatric consultation and evaluation, as well as ongoing medical and medication management to treat the patient's condition.  The patient has been placed under full IVC at this time.   Clinical Course as of 09/28/20 2952  Caleen Essex Sep 28, 2020  0631 Fosfomycin given for trace leukocyte positive UTI.  Patient was evaluated by psychiatric NP overnight; plan to reassess this morning by psychiatry. [JS]    Clinical Course User Index [JS] Irean Hong, MD     ____________________________________________   FINAL CLINICAL IMPRESSION(S) / ED DIAGNOSES  Final diagnoses:  Bipolar affective disorder, currently depressed, moderate [facility])     ED Discharge Orders     None        Note:  This document was prepared using Dragon voice recognition software and may include unintentional dictation errors.    Irean Hong, MD 09/28/20 [phone]

## 2020-09-28 NOTE — Consult Note (Signed)
Brief note. Patient does not meet IVC criteria and does not need inpatint treatment. DC the IVC and case reviewed with ER doctor

## 2020-09-28 NOTE — ED Notes (Signed)
Socks Boots Jeans with belt Orange tshirt 2 pairs of silver earring Silver chain All locked in [employer] area

## 2020-12-24 ENCOUNTER — Ambulatory Visit: Admit: 2020-12-24 | Discharge: 2020-12-24 | Payer: MEDICAID

## 2020-12-24 DIAGNOSIS — E559 Vitamin D deficiency, unspecified: Principal | ICD-10-CM

## 2020-12-24 DIAGNOSIS — F3181 Bipolar II disorder: Principal | ICD-10-CM

## 2020-12-24 DIAGNOSIS — Z114 Encounter for screening for human immunodeficiency virus [HIV]: Principal | ICD-10-CM

## 2020-12-24 DIAGNOSIS — Z1159 Encounter for screening for other viral diseases: Principal | ICD-10-CM

## 2020-12-24 DIAGNOSIS — K860 Alcohol-induced chronic pancreatitis: Principal | ICD-10-CM

## 2020-12-24 DIAGNOSIS — K8689 Other specified diseases of pancreas: Principal | ICD-10-CM

## 2020-12-24 DIAGNOSIS — F1010 Alcohol abuse, uncomplicated: Principal | ICD-10-CM

## 2020-12-24 DIAGNOSIS — F1510 Other stimulant abuse, uncomplicated: Principal | ICD-10-CM

## 2020-12-26 DIAGNOSIS — K8689 Other specified diseases of pancreas: Principal | ICD-10-CM

## 2020-12-26 DIAGNOSIS — K860 Alcohol-induced chronic pancreatitis: Principal | ICD-10-CM

## 2020-12-26 MED ORDER — LIPASE 3,000-PROTEASE 9,500-AMYLASE 15,000 UNIT CAPSULE, DELAYED REL
ORAL_CAPSULE | Freq: Three times a day (TID) | ORAL | 3 refills | 90 days | Status: CP
Start: 2020-12-26 — End: 2021-12-26
  Filled 2020-12-31: qty 210, 70d supply, fill #0

## 2021-03-13 DIAGNOSIS — K8689 Other specified diseases of pancreas: Principal | ICD-10-CM

## 2021-03-13 DIAGNOSIS — K860 Alcohol-induced chronic pancreatitis: Principal | ICD-10-CM

## 2021-03-13 MED ORDER — LIPASE 3,000-PROTEASE 9,500-AMYLASE 15,000 UNIT CAPSULE, DELAYED REL
ORAL_CAPSULE | Freq: Three times a day (TID) | ORAL | 3 refills | 90 days | Status: CP
Start: 2021-03-13 — End: 2022-03-13

## 2021-04-16 ENCOUNTER — Telehealth
Admit: 2021-04-16 | Discharge: 2021-04-17 | Payer: MEDICAID | Attending: Student in an Organized Health Care Education/Training Program | Primary: Student in an Organized Health Care Education/Training Program

## 2021-04-16 DIAGNOSIS — R059 Cough, unspecified type: Principal | ICD-10-CM

## 2021-04-16 MED ORDER — GUAIFENESIN 100 MG/5 ML ORAL LIQUID
Freq: Three times a day (TID) | ORAL | 0 refills | 4.00000 days | Status: CP | PRN
Start: 2021-04-16 — End: 2021-04-30

## 2021-04-16 MED ORDER — BENZONATATE 100 MG CAPSULE
ORAL_CAPSULE | Freq: Three times a day (TID) | ORAL | 1 refills | 10 days | Status: CP | PRN
Start: 2021-04-16 — End: 2021-05-06

## 2021-08-27 ENCOUNTER — Other Ambulatory Visit: Payer: Self-pay

## 2021-08-27 ENCOUNTER — Encounter: Payer: Self-pay | Admitting: Intensive Care

## 2021-08-27 ENCOUNTER — Emergency Department (EMERGENCY_DEPARTMENT_HOSPITAL)
Admission: EM | Admit: 2021-08-27 | Discharge: 2021-08-28 | Disposition: A | Discharge status: Other Acute Inpatient Hospital | Payer: No Typology Code available for payment source | Source: Home / Self Care | Attending: Emergency Medicine | Admitting: Emergency Medicine

## 2021-08-27 DIAGNOSIS — Z20822 Contact with and (suspected) exposure to covid-19: Secondary | ICD-10-CM | POA: Insufficient documentation

## 2021-08-27 DIAGNOSIS — F989 Unspecified behavioral and emotional disorders with onset usually occurring in childhood and adolescence: Secondary | ICD-10-CM | POA: Insufficient documentation

## 2021-08-27 DIAGNOSIS — R4689 Other symptoms and signs involving appearance and behavior: Secondary | ICD-10-CM

## 2021-08-27 DIAGNOSIS — F312 Bipolar disorder, current episode manic severe with psychotic features: Secondary | ICD-10-CM | POA: Insufficient documentation

## 2021-08-27 DIAGNOSIS — Z046 Encounter for general psychiatric examination, requested by authority: Secondary | ICD-10-CM | POA: Insufficient documentation

## 2021-08-27 LAB — SALICYLATE LEVEL: Salicylate Lvl: <7 mg/dL — ABNORMAL LOW (ref 7.0–30.0)

## 2021-08-27 LAB — CBC WITH DIFFERENTIAL/PLATELET
Abs Immature Granulocytes: 0.02 10*3/uL (ref 0.00–0.07)
Basophils Absolute: 0 10*3/uL (ref 0.0–0.1)
Basophils Relative: 1 %
Eosinophils Absolute: 0.2 10*3/uL (ref 0.0–0.5)
Eosinophils Relative: 3 %
HCT: 41.1 % (ref 36.0–46.0)
Hemoglobin: 12.3 g/dL (ref 12.0–15.0)
Immature Granulocytes: 0 %
Lymphocytes Relative: 18 %
Lymphs Abs: 1.3 10*3/uL (ref 0.7–4.0)
MCH: 31.9 pg (ref 26.0–34.0)
MCHC: 29.9 g/dL — ABNORMAL LOW (ref 30.0–36.0)
MCV: 106.5 fL — ABNORMAL HIGH (ref 80.0–100.0)
Monocytes Absolute: 0.8 10*3/uL (ref 0.1–1.0)
Monocytes Relative: 12 %
Neutro Abs: 4.9 10*3/uL (ref 1.7–7.7)
Neutrophils Relative %: 66 %
Platelets: 377 10*3/uL (ref 150–400)
RBC: 3.86 MIL/uL — ABNORMAL LOW (ref 3.87–5.11)
RDW: 14.8 % (ref 11.5–15.5)
WBC: 7.2 10*3/uL (ref 4.0–10.5)
nRBC: 0 % (ref 0.0–0.2)

## 2021-08-27 LAB — URINE DRUG SCREEN, QUALITATIVE (ARMC ONLY)
Amphetamines, Ur Screen: POSITIVE — AB
Barbiturates, Ur Screen: NOT DETECTED
Benzodiazepine, Ur Scrn: NOT DETECTED
Cannabinoid 50 Ng, Ur ~~LOC~~: NOT DETECTED
Cocaine Metabolite,Ur ~~LOC~~: NOT DETECTED
MDMA (Ecstasy)Ur Screen: NOT DETECTED
Methadone Scn, Ur: NOT DETECTED
Opiate, Ur Screen: NOT DETECTED
Phencyclidine (PCP) Ur S: NOT DETECTED
Tricyclic, Ur Screen: NOT DETECTED

## 2021-08-27 LAB — COMPREHENSIVE METABOLIC PANEL
ALT: 16 U/L (ref 0–44)
AST: 20 U/L (ref 15–41)
Albumin: 3.7 g/dL (ref 3.5–5.0)
Alkaline Phosphatase: 62 U/L (ref 38–126)
Anion gap: 10 (ref 5–15)
BUN: 14 mg/dL (ref 6–20)
CO2: 21 mmol/L — ABNORMAL LOW (ref 22–32)
Calcium: 8.3 mg/dL — ABNORMAL LOW (ref 8.9–10.3)
Chloride: 109 mmol/L (ref 98–111)
Creatinine, Ser: 0.72 mg/dL (ref 0.44–1.00)
GFR, Estimated: >60 mL/min (ref >60)
Glucose, Bld: 100 mg/dL — ABNORMAL HIGH (ref 70–99)
Potassium: 3.7 mmol/L (ref 3.5–5.1)
Sodium: 140 mmol/L (ref 135–145)
Total Bilirubin: 0.8 mg/dL (ref 0.3–1.2)
Total Protein: 7.1 g/dL (ref 6.5–8.1)

## 2021-08-27 LAB — ETHANOL: Alcohol, Ethyl (B): <10 mg/dL (ref <10)

## 2021-08-27 LAB — SARS CORONAVIRUS 2 BY RT PCR: SARS Coronavirus 2 by RT PCR: NEGATIVE

## 2021-08-27 LAB — POC URINE PREG, ED: Preg Test, Ur: NEGATIVE

## 2021-08-27 LAB — ACETAMINOPHEN LEVEL: Acetaminophen (Tylenol), Serum: <10 ug/mL — ABNORMAL LOW (ref 10–30)

## 2021-08-27 MED ORDER — HYDROXYZINE HCL 10 MG PO TABS: 10.0000 mg | ORAL_TABLET | Freq: Three times a day (TID) | ORAL | Status: DC | PRN

## 2021-08-27 MED ORDER — TRAZODONE HCL 100 MG PO TABS: 50.0000 mg | ORAL_TABLET | Freq: Every evening | ORAL | Status: DC | PRN

## 2021-08-27 MED ORDER — FLUOXETINE HCL 10 MG PO CAPS
10.0000 mg | ORAL_CAPSULE | Freq: Every day | ORAL | Status: DC
Start: 2021-08-27 — End: 2021-08-28
  Administered 2021-08-28: 10 mg via ORAL
  Filled 2021-08-27 (×2): qty 1

## 2021-08-27 MED ORDER — LORAZEPAM 1 MG PO TABS: 1.0000 mg | ORAL_TABLET | Freq: Once | ORAL | Status: DC

## 2021-08-27 MED ORDER — LORAZEPAM 1 MG PO TABS
1.0000 mg | ORAL_TABLET | Freq: Once | ORAL | Status: AC
  Administered 2021-08-27: 1 mg via ORAL
  Filled 2021-08-27: qty 1

## 2021-08-27 NOTE — ED Triage Notes (Addendum)
Patient arrived with PD with IVC papers. Patient has been up X1-2 days and mother took out papers on her. Patient is rambling from subject to subject and does not make sense when speaking. She is upset/loud and yelling in triage. Patient appears manic

## 2021-08-27 NOTE — ED Notes (Signed)
INVOLUNTARY with all papers on chart/awaiting TTS/PSYCH consult ?

## 2021-08-27 NOTE — BH Assessment (Signed)
Comprehensive Clinical Assessment (CCA) Screening, Triage and Referral Note  08/27/2021 Tamara Nguyen [id number]  Tamara Nguyen, 41 year old female who presents to [facility] ED involuntarily for treatment. Per triage note, Patient arrived with PD with IVC papers. Patient has been up X1-2 days and mother took out papers on her. Patient is rambling from subject to subject and does not make sense when speaking. She is upset/loud and yelling in triage. Patient appears manic.    During TTS assessment pt presents alert and oriented x 4, restless but cooperative, and mood-congruent with affect. The pt appears to be responding to internal or external stimuli. Pt presenting with delusional thinking. Pt verified the information provided to triage RN.   Pt identifies her main complaint to be that she is not sure why she was brought to the ED. Patient presents with disorganized, pressured speech and nonsensical statements. Patient was moving back and forth while sitting down eating her breakfast. Patient reports she does not currently take any medications and denies having a mental health diagnosis. Patient reports her eating habits fluctuate and she has not been able to sleep for several days. Patient reports she lives with her husband and 2 kids. Patient denies using any illicit substances and alcohol; however, her UDS is positive for amphetamines. Pt denies current SI/HI/AH/VH.    Per Barbaraann Share, NP, pt is recommended for overnight observation to be reassessed in the morning.  Chief Complaint: No chief complaint on file.  Visit Diagnosis: Bipolar I disorder, current or most recent episode manic, with psychotic features  Patient Reported Information How did you hear about [place]? -- Risk manager)  What Is the Reason for Your Visit/Call Today? Patient states she is not sure why she was brought to the ED.  How Long Has This Been Causing You Problems? <Week  What Do You Feel Would Help You the Most Today? --  (Assessment only)   Have You Recently Had Any Thoughts About Hurting Yourself? No  Are You Planning to Commit Suicide/Harm Yourself At This time? No   Have you Recently Had Thoughts About [place]? No  Are You Planning to Harm Someone at This Time? No  Explanation: No data recorded  Have You Used Any Alcohol or Drugs in the Past 24 Hours? Yes  How Long Ago Did You Use Drugs or Alcohol? No data recorded What Did You Use and How Much? Methamphetamines   Do You Currently Have a Therapist/Psychiatrist? No  Name of Therapist/Psychiatrist: No data recorded  Have You Been Recently Discharged From Any Office Practice or Programs? No  Explanation of Discharge From Practice/Program: No data recorded   CCA Screening Triage Referral Assessment Type of Contact: Face-to-Face  Telemedicine Service Delivery:   Is this Initial or Reassessment? No data recorded Date Telepsych consult ordered in [facility]:  No data recorded Time Telepsych consult ordered in [facility]:  No data recorded Location of Assessment: [facility] ED  Provider Location: [facility] ED   Collateral Involvement: None provided   Does Patient Have a [place]? No data recorded Name and Contact of Legal Guardian: No data recorded If Minor and Not Living with Parent(s), Who has Custody? n/a  Is CPS involved or ever been involved? Never  Is APS involved or ever been involved? Never   Patient Determined To Be At Risk for Harm To Self or Others Based on Review of Patient Reported Information or Presenting Complaint? No  Method: No data recorded Availability of Means: No data recorded Intent:  No data recorded Notification Required: No data recorded Additional Information for Danger to Others Potential: No data recorded Additional Comments for Danger to Others Potential: No data recorded Are There Guns or Other Weapons in Your Home? No data recorded Types of Guns/Weapons: No data recorded Are These  Weapons Safely Secured?                            No data recorded Who Could Verify You Are Able To Have These Secured: No data recorded Do You Have any Outstanding Charges, Pending Court Dates, Parole/Probation? No data recorded Contacted To Inform of Risk of Harm To Self or Others: No data recorded  Does Patient Present under Involuntary Commitment? Yes  IVC Papers Initial File Date: 08/27/21   [place] of Residence: Tamara Nguyen   Patient Currently Receiving the Following Services: Medication Management   Determination of Need: Emergent (2 hours)   Options For Referral: ED Visit   Discharge Disposition:     Eula Fried, Counselor, LCAS-A

## 2021-08-27 NOTE — Consult Note (Signed)
[facility] Face-to-Face Psychiatry Consult   Reason for Consult:  manic behavior Referring Physician:  Michiel Sites Patient Identification: Tamara Nguyen MRN:  [id number] Principal Diagnosis: Bipolar I disorder, current or most recent episode manic, with psychotic features (HCC) Diagnosis:  Principal Problem:   Bipolar I disorder, current or most recent episode manic, with psychotic features (HCC)   Total Time spent with patient: 45 minutes  Subjective:"I live with my  husband, Camelia Phenes, and my kids."    Tamara Nguyen is a 41 y.o. female patient admitted with manic behavior.  HPI:  Patient has a history bipolar 1 disorder. Patient presents to ED under IVC, petitioned by her mother for no sleeping for several days. Patient does not take medications on a regular basis and UDS is positive for amphetamines.  On approach, patient was sleeping, but quickly sat up, started to stand, but then sat down on the bed. Patient has very disorganized, pressured speech, difficult to understand. She denies that she has any mental health diagnosis, then says she is bipolar. Denies illicit drug use, even when told she is positive for amphetamine. Patient unable to answer other questions regarding sleep and appetite. She started eating her lunch, Motions are in swift movements. Patient is nonsensical throughout brief interview. Per report, she has had very little sleep. Patient was given Ativan. Patient continues to meet criteria for IVC. She may require inpatient psychiatric hospitalization, but will need to be reassessed when patient has slept and no longer under influence of amphetamines.     Collateral from Mother, Lanora Manis, [phone]:  Patient lives with her. Son lives with dad and daughter lives with boyfriend. Patient was in jail for 120 days for assaulting her mother; she was released on Mar 20, 23. The last 3 nights patient has not slept, she was threatening mother, yelling loudly, having conversations  with "people who are not there." Mother states that patient has seen providers at [facility] in the past, but has not seen anyone since she was released from jail this time. Writer advised mother that we will keep patient in the ED overnight. Patient will be re-assessed when she is able to participate further in the evaluation.   Past Psychiatric History: bipolar 1 disorder. Patient was inpatient at [facility] in Oct 2021. She has presented to ED several times with similar symptoms and UDS positive for amphetamines.   Risk to Self:   Risk to Others:   Prior Inpatient Therapy:   Prior Outpatient Therapy:    Past Medical History:  Past Medical History:  Diagnosis Date   Major depressive disorder     Past Surgical History:  Procedure Laterality Date   PANCREAS SURGERY     Family History: History reviewed. No pertinent family history. Family Psychiatric  History:  Social History:  Social History   Substance and Sexual Activity  Alcohol Use No     Social History   Substance and Sexual Activity  Drug Use Yes   Types: Methamphetamines    Social History   Socioeconomic History   Marital status: Single    Spouse name: Not on file   Number of children: Not on file   Years of education: Not on file   Highest education level: Not on file  Occupational History   Not on file  Tobacco Use   Smoking status: Every Day    Packs/day: 1.00    Types: Cigarettes   Smokeless tobacco: Never  Vaping Use   Vaping Use: Never used  Substance and Sexual Activity   Alcohol use: No   Drug use: Yes    Types: Methamphetamines   Sexual activity: Not Currently  Other Topics Concern   Not on file  Social History Narrative   Not on file   Social Determinants of Health   Financial Resource Strain: Not on file  Food Insecurity: Not on file  Transportation Needs: Not on file  Physical Activity: Not on file  Stress: Not on file  Social Connections: Not on file   Additional Social History:     Allergies:  No Known Allergies  Labs:  Results for orders placed or performed during the hospital encounter of 08/27/21 (from the past 48 hour(s))  Comprehensive metabolic panel     Status: Abnormal   Collection Time: 08/27/21 10:25 AM  Result Value Ref Range   Sodium 140 135 - 145 mmol/L   Potassium 3.7 3.5 - 5.1 mmol/L   Chloride 109 98 - 111 mmol/L   CO2 21 (L) 22 - 32 mmol/L   Glucose, Bld 100 (H) 70 - 99 mg/dL    Comment: Glucose reference range applies only to samples taken after fasting for at least 8 hours.   BUN 14 6 - 20 mg/dL   Creatinine, Ser 0.05 0.44 - 1.00 mg/dL   Calcium 8.3 (L) 8.9 - 10.3 mg/dL   Total Protein 7.1 6.5 - 8.1 g/dL   Albumin 3.7 3.5 - 5.0 g/dL   AST 20 15 - 41 U/L   ALT 16 0 - 44 U/L   Alkaline Phosphatase 62 38 - 126 U/L   Total Bilirubin 0.8 0.3 - 1.2 mg/dL   GFR, Estimated >11 >02 mL/min    Comment: (NOTE) Calculated using the CKD-EPI Creatinine Equation (2021)    Anion gap 10 5 - 15    Comment: Performed at [facility], [address], [place], [place] [address]  Ethanol     Status: None   Collection Time: 08/27/21 10:25 AM  Result Value Ref Range   Alcohol, Ethyl (B) <10 <10 mg/dL    Comment: (NOTE) Lowest detectable limit for serum alcohol is 10 mg/dL.  For medical purposes only. Performed at [facility], [address] [place], [place] [address]   Urine Drug Screen, Qualitative     Status: Abnormal   Collection Time: 08/27/21 10:25 AM  Result Value Ref Range   Tricyclic, Ur Screen NONE DETECTED NONE DETECTED   Amphetamines, Ur Screen POSITIVE (A) NONE DETECTED   MDMA (Ecstasy)Ur Screen NONE DETECTED NONE DETECTED   Cocaine Metabolite,Ur [place] NONE DETECTED NONE DETECTED   Opiate, Ur Screen NONE DETECTED NONE DETECTED   Phencyclidine (PCP) Ur S NONE DETECTED NONE DETECTED   Cannabinoid 50 Ng, Ur [place] NONE DETECTED NONE DETECTED   Barbiturates, Ur Screen NONE DETECTED NONE DETECTED   Benzodiazepine, Ur Scrn  NONE DETECTED NONE DETECTED   Methadone Scn, Ur NONE DETECTED NONE DETECTED    Comment: (NOTE) Tricyclics + metabolites, urine    Cutoff 1000 ng/mL Amphetamines + metabolites, urine  Cutoff 1000 ng/mL MDMA (Ecstasy), urine              Cutoff 500 ng/mL Cocaine Metabolite, urine          Cutoff 300 ng/mL Opiate + metabolites, urine        Cutoff 300 ng/mL Phencyclidine (PCP), urine         Cutoff 25 ng/mL Cannabinoid, urine  Cutoff 50 ng/mL Barbiturates + metabolites, urine  Cutoff 200 ng/mL Benzodiazepine, urine              Cutoff 200 ng/mL Methadone, urine                   Cutoff 300 ng/mL  The urine drug screen provides only a preliminary, unconfirmed analytical test result and should not be used for non-medical purposes. Clinical consideration and professional judgment should be applied to any positive drug screen result due to possible interfering substances. A more specific alternate chemical method must be used in order to obtain a confirmed analytical result. Gas chromatography / mass spectrometry (GC/MS) is the preferred confirm atory method. Performed at [facility], [address] [place], [place] [address]   CBC with Diff     Status: Abnormal   Collection Time: 08/27/21 10:25 AM  Result Value Ref Range   WBC 7.2 4.0 - 10.5 K/uL   RBC 3.86 (L) 3.87 - 5.11 MIL/uL   Hemoglobin 12.3 12.0 - 15.0 g/dL   HCT 62.1 30.8 - 65.7 %   MCV 106.5 (H) 80.0 - 100.0 fL   MCH 31.9 26.0 - 34.0 pg   MCHC 29.9 (L) 30.0 - 36.0 g/dL   RDW 84.6 96.2 - 95.2 %   Platelets 377 150 - 400 K/uL   nRBC 0.0 0.0 - 0.2 %   Neutrophils Relative % 66 %   Neutro Abs 4.9 1.7 - 7.7 K/uL   Lymphocytes Relative 18 %   Lymphs Abs 1.3 0.7 - 4.0 K/uL   Monocytes Relative 12 %   Monocytes Absolute 0.8 0.1 - 1.0 K/uL   Eosinophils Relative 3 %   Eosinophils Absolute 0.2 0.0 - 0.5 K/uL   Basophils Relative 1 %   Basophils Absolute 0.0 0.0 - 0.1 K/uL   Immature Granulocytes 0 %    Abs Immature Granulocytes 0.02 0.00 - 0.07 K/uL    Comment: Performed at [facility], [address] [place], [place] [address]  Acetaminophen level     Status: Abnormal   Collection Time: 08/27/21 10:25 AM  Result Value Ref Range   Acetaminophen (Tylenol), Serum <10 (L) 10 - 30 ug/mL    Comment: (NOTE) Therapeutic concentrations vary significantly. A range of 10-30 ug/mL  may be an effective concentration for many patients. However, some  are best treated at concentrations outside of this range. Acetaminophen concentrations >150 ug/mL at 4 hours after ingestion  and >50 ug/mL at 12 hours after ingestion are often associated with  toxic reactions.  Performed at [facility], [address] [place], [place] [address]   Salicylate level     Status: Abnormal   Collection Time: 08/27/21 10:25 AM  Result Value Ref Range   Salicylate Lvl <7.0 (L) 7.0 - 30.0 mg/dL    Comment: Performed at [facility], [address] [place], [place] [address]  SARS Coronavirus 2 by RT PCR (hospital order, performed in [facility] hospital lab) *cepheid single result test* Anterior Nasal Swab     Status: None   Collection Time: 08/27/21 10:25 AM   Specimen: Anterior Nasal Swab  Result Value Ref Range   SARS Coronavirus 2 by RT PCR NEGATIVE NEGATIVE    Comment: (NOTE) SARS-CoV-2 target nucleic acids are NOT DETECTED.  The SARS-CoV-2 RNA is generally detectable in upper and lower respiratory specimens during the acute phase of infection. The lowest concentration of SARS-CoV-2 viral copies this assay can detect is  250 copies / mL. A negative result does not preclude SARS-CoV-2 infection and should not be used as the sole basis for treatment or other patient management decisions.  A negative result may occur with improper specimen collection / handling, submission of specimen other than nasopharyngeal swab, presence of viral mutation(s) within the areas targeted by this  assay, and inadequate number of viral copies (<250 copies / mL). A negative result must be combined with clinical observations, patient history, and epidemiological information.  Fact Sheet for Patients:   RoadLapTop.co.za  Fact Sheet for Healthcare Providers: http://kim-miller.com/  This test is not yet approved or  cleared by the [place] FDA and has been authorized for detection and/or diagnosis of SARS-CoV-2 by FDA under an Emergency Use Authorization (EUA).  This EUA will remain in effect (meaning this test can be used) for the duration of the COVID-19 declaration under Section 564(b)(1) of the Act, 21 U.S.C. section 360bbb-3(b)(1), unless the authorization is terminated or revoked sooner.  Performed at [facility], [address] [place], [place] [address]   POC urine preg, ED     Status: None   Collection Time: 08/27/21 10:31 AM  Result Value Ref Range   Preg Test, Ur NEGATIVE NEGATIVE    Comment:        THE SENSITIVITY OF THIS METHODOLOGY IS >24 mIU/mL     Current Facility-Administered Medications  Medication Dose Route Frequency Provider Last Rate Last Admin   FLUoxetine (PROZAC) capsule 10 mg  10 mg Oral Daily Gilles Chiquito, MD       hydrOXYzine (ATARAX) tablet 10 mg  10 mg Oral TID PRN Gilles Chiquito, MD       traZODone (DESYREL) tablet 50 mg  50 mg Oral QHS PRN Gilles Chiquito, MD       Current Outpatient Medications  Medication Sig Dispense Refill   CREON 3000-9500 units CPEP Take 1 capsule by mouth 3 (three) times daily. (Patient not taking: Reported on 09/28/2020)     FLUoxetine (PROZAC) 10 MG capsule Take 1 capsule by mouth daily. (Patient not taking: Reported on 08/27/2021)     hydrOXYzine (ATARAX/VISTARIL) 10 MG tablet Take 1 tablet by mouth 3 (three) times daily as needed. (Patient not taking: Reported on 08/27/2021)     traZODone (DESYREL) 50 MG tablet Take 1 tablet (50 mg total) by mouth at  bedtime as needed for sleep. (Patient not taking: Reported on 08/27/2021) 30 tablet 0    Musculoskeletal: Strength & Muscle Tone: within normal limits Gait & Station: normal Patient leans: N/A  Psychiatric Specialty Exam:  Presentation  General Appearance: Bizarre  Eye Contact:None  Speech:Garbled; Pressured  Speech Volume:Decreased  Handedness:Right   Mood and Affect  Mood:Dysphoric; Irritable; Labile  Affect:Blunt; Full Range   Thought Process  Thought Processes:Irrevelant  Descriptions of Associations:Loose  Orientation:Partial  Thought Content:Scattered  History of Schizophrenia/Schizoaffective disorder:No  Duration of Psychotic Symptoms:No data recorded Hallucinations:Hallucinations: -- (UTA)  Ideas of Reference:None  Suicidal Thoughts:Suicidal Thoughts: -- (UTA)  Homicidal Thoughts:Homicidal Thoughts: -- Rich Reining)   Sensorium  Memory:Immediate Poor  Judgment:Fair  Insight:Poor   Executive Functions  Concentration:Poor  Attention Span:Poor  Recall:Poor  Fund of Knowledge:Poor  Language:Poor   Psychomotor Activity  Psychomotor Activity:Psychomotor Activity: Increased   Assets  Assets:Resilience; Social Support; Housing   Sleep  Sleep:Sleep: Poor   Physical Exam: Physical Exam Vitals and nursing note reviewed.  Constitutional:      Comments: INATTENTIVE  HENT:     Head: Normocephalic.  Nose: No congestion or rhinorrhea.  Eyes:     General:        Right eye: No discharge.        Left eye: No discharge.  Cardiovascular:     Rate and Rhythm: Normal rate.  Pulmonary:     Effort: Pulmonary effort is normal.  Musculoskeletal:        General: Normal range of motion.     Cervical back: Normal range of motion.  Skin:    General: Skin is dry.    Review of Systems  Psychiatric/Behavioral:  Positive for depression and substance abuse.    Blood pressure (!) 138/98, pulse 99, temperature 98.6 F (37 C), temperature source  Oral, resp. rate 20, height 5\' 1"  (1.549 m), weight 86.2 kg, SpO2 95 %. Body mass index is 35.9 kg/m.  Treatment Plan Summary: EDP re-stated patient's home medications. Ativan, 1 dose given. Reassess patient when she is able to participate more fully in evaluation. She may be able to discharge. Reviewed with EDP  Disposition: Reassess patient when she is able to participate more fully in evaluation. Reviewed with EDP   Vanetta MuldersLouise F Jadaya Sommerfield, NP 08/27/2021 12:35 PM

## 2021-08-27 NOTE — ED Notes (Signed)
Report received from [facility]. Patient to be transferred to [facility] room 3.

## 2021-08-27 NOTE — ED Notes (Signed)
Pt requested ativan after using restroom. She is irritable but cooperative. Returned to bed for assessment. Light off per pt request. Dr. Sidney Ace notified of pt request and actions.

## 2021-08-27 NOTE — ED Notes (Signed)
Pt received snack and drink 

## 2021-08-27 NOTE — ED Notes (Signed)
Report received from Annie, RN. Patient currently sleeping, respirations regular and unlabored. Q15 minute rounds and observation by Rover and Officer to continue. Will assess patient once awake.  °

## 2021-08-27 NOTE — ED Notes (Signed)
Patient given dinner tray.

## 2021-08-27 NOTE — ED Notes (Signed)
This tech and Pattricia Boss, RN assisted pt change into beh health scrubs.  Pt belongings include,  Teal tshirt, pink sweatpants, grey sweater, white socks, teal slippery and a silver necklace

## 2021-08-27 NOTE — ED Provider Notes (Signed)
[facility] Provider Note    Event Date/Time   First MD Initiated Contact with Patient 08/27/21 1007     (approximate)   History   No chief complaint on file.   HPI  Tamara Nguyen is a 41 y.o. female with a past medical history of bipolar disorder with psychotic features as well as psychostimulant induced psychosis with delusions and previous amphetamine abuse who presents for evaluation after IVC paperwork was filled out by her mother for assessment with concerns for decompensated mania.  Patient endorses very poor sleep over the last couple days and states she is not sure when she last slept.  Denies any SI or HI although is extremely irritable and tangential with this examiner and unable provide any additional history.  He is refusing to answer any other direct questions about any acute physical symptoms or current medications although is denying any recent illicit drug use.  No other history is available from patient.  I did speak with officers at bedside who brought to the emergency room and states that she has been somewhat nonsensical and intermittently yelling at them.      Physical Exam  Triage Vital Signs: ED Triage Vitals  Enc Vitals Group     BP 08/27/21 1001 (!) 138/98     Pulse Rate 08/27/21 0954 99     Resp 08/27/21 0954 20     Temp 08/27/21 1001 98.6 F (37 C)     Temp Source 08/27/21 0954 Oral     SpO2 08/27/21 0954 95 %     Weight 08/27/21 1001 190 lb (86.2 kg)     Height 08/27/21 1001 5\' 1"  (1.549 m)     Head Circumference --      Peak Flow --      Pain Score --      Pain Loc --      Pain Edu? --      Excl. in GC? --     Most recent vital signs: Vitals:   08/27/21 0954 08/27/21 1001  BP:  (!) 138/98  Pulse: 99   Resp: 20   Temp:  98.6 F (37 C)  SpO2: 95%     General: Awake, peers uncomfortable and very irritable. CV:  Extremities appear to be perfusing appropriately but patient is refusing any additional  cardiovascular exam. Resp:  Normal effort.  Abd:  No distention.  Other:  Patient is pressured and very tangential.  She is denying any SI or HI.   ED Results / Procedures / Treatments  Labs (all labs ordered are listed, but only abnormal results are displayed) Labs Reviewed  COMPREHENSIVE METABOLIC PANEL - Abnormal; Notable for the following components:      Result Value   CO2 21 (*)    Glucose, Bld 100 (*)    Calcium 8.3 (*)    All other components within normal limits  URINE DRUG SCREEN, QUALITATIVE ([facility] ONLY) - Abnormal; Notable for the following components:   Amphetamines, Ur Screen POSITIVE (*)    All other components within normal limits  CBC WITH DIFFERENTIAL/PLATELET - Abnormal; Notable for the following components:   RBC 3.86 (*)    MCV 106.5 (*)    MCHC 29.9 (*)    All other components within normal limits  ACETAMINOPHEN LEVEL - Abnormal; Notable for the following components:   Acetaminophen (Tylenol), Serum <10 (*)    All other components within normal limits  SALICYLATE LEVEL - Abnormal; Notable for the following  components:   Salicylate Lvl <7.0 (*)    All other components within normal limits  SARS CORONAVIRUS 2 BY RT PCR  ETHANOL  POC URINE PREG, ED     EKG    RADIOLOGY    PROCEDURES:  Critical Care performed: No  Procedures  MEDICATIONS ORDERED IN ED: Medications  traZODone (DESYREL) tablet 50 mg (has no administration in time range)  hydrOXYzine (ATARAX) tablet 10 mg (has no administration in time range)  FLUoxetine (PROZAC) capsule 10 mg (0 mg Oral Hold 08/27/21 1112)  LORazepam (ATIVAN) tablet 1 mg (1 mg Oral Given 08/27/21 1039)     IMPRESSION / MDM / ASSESSMENT AND PLAN / ED COURSE  I reviewed the triage vital signs and the nursing notes. Patient's presentation is most consistent with acute presentation with potential threat to life or bodily function.                               Differential diagnosis includes, but is not  limited to possibly decompensated underlying bipolar disorder versus possible substance-induced mania.  He is refusing to speak with this examiner is a somewhat difficult to obtain more history but there are no obvious cervical or exam features to look just a significant trauma or significant infectious process.  Routine psychiatric screening labs sent.  Psychiatry TTS consulted.  The patient has been placed in psychiatric observation due to the need to provide a safe environment for the patient while obtaining psychiatric consultation and evaluation, as well as ongoing medical and medication management to treat the patient's condition.  The patient has been placed under full IVC at this time.  CMP shows no significant electrolyte or metabolic derangements.  Serum ethanol, salicylate level and acetaminophen level undetectable.  COVID influenza PCR negative.  Pregnancy test is negative.  CBC shows no leukocytosis or acute anemia.  UDS is positive for amphetamines.       FINAL CLINICAL IMPRESSION(S) / ED DIAGNOSES   Final diagnoses:  Behavior concern     Rx / DC Orders   ED Discharge Orders     None        Note:  This document was prepared using Dragon voice recognition software and may include unintentional dictation errors.   Gilles Chiquito, MD 08/27/21 1157

## 2021-08-27 NOTE — ED Notes (Signed)
Pt. To BHU from ED ambulatory without difficulty, to room  BHU 3. Report from Chris RN. Pt. Is alert and oriented, warm and dry in no distress. Pt. Denies SI, HI, and AVH. Pt. Calm and cooperative. Pt. Made aware of security cameras and Q15 minute rounds. Pt. Encouraged to let Nursing staff know of any concerns or needs.   ENVIRONMENTAL ASSESSMENT Potentially harmful objects out of patient reach: Yes.   Personal belongings secured: Yes.   Patient dressed in hospital provided attire only: Yes.   Plastic bags out of patient reach: Yes.   Patient care equipment (cords, cables, call bells, lines, and drains) shortened, removed, or accounted for: Yes.   Equipment and supplies removed from bottom of stretcher: Yes.   Potentially toxic materials out of patient reach: Yes.   Sharps container removed or out of patient reach: Yes.    

## 2021-08-27 NOTE — ED Notes (Signed)
INVOLUNTARY TTS/PSYCH consult done will reasses when pt can participate more freely

## 2021-08-27 NOTE — ED Notes (Signed)
Breakfast tray given. °

## 2021-08-28 ENCOUNTER — Other Ambulatory Visit: Payer: Self-pay

## 2021-08-28 ENCOUNTER — Encounter: Payer: Self-pay | Admitting: Psychiatry

## 2021-08-28 ENCOUNTER — Inpatient Hospital Stay
Admission: AD | Admit: 2021-08-28 | Discharge: 2021-08-31 | DRG: 897 | Disposition: A | Discharge status: Home / Self Care | Payer: No Typology Code available for payment source | Source: Intra-hospital | Attending: Psychiatry | Admitting: Psychiatry

## 2021-08-28 DIAGNOSIS — F312 Bipolar disorder, current episode manic severe with psychotic features: Secondary | ICD-10-CM | POA: Diagnosis not present

## 2021-08-28 DIAGNOSIS — F15150 Other stimulant abuse with stimulant-induced psychotic disorder with delusions: Principal | ICD-10-CM | POA: Diagnosis present

## 2021-08-28 DIAGNOSIS — Z20822 Contact with and (suspected) exposure to covid-19: Secondary | ICD-10-CM | POA: Diagnosis present

## 2021-08-28 DIAGNOSIS — F319 Bipolar disorder, unspecified: Secondary | ICD-10-CM | POA: Insufficient documentation

## 2021-08-28 DIAGNOSIS — F17210 Nicotine dependence, cigarettes, uncomplicated: Secondary | ICD-10-CM | POA: Diagnosis present

## 2021-08-28 DIAGNOSIS — F419 Anxiety disorder, unspecified: Secondary | ICD-10-CM | POA: Diagnosis present

## 2021-08-28 DIAGNOSIS — F321 Major depressive disorder, single episode, moderate: Secondary | ICD-10-CM | POA: Diagnosis present

## 2021-08-28 DIAGNOSIS — F15950 Other stimulant use, unspecified with stimulant-induced psychotic disorder with delusions: Secondary | ICD-10-CM | POA: Diagnosis not present

## 2021-08-28 MED ORDER — FLUOXETINE HCL 10 MG PO CAPS: 10.0000 mg | ORAL_CAPSULE | Freq: Every day | ORAL | Status: DC

## 2021-08-28 MED ORDER — PANCRELIPASE (LIP-PROT-AMYL) 12000-38000 UNITS PO CPEP
24000.0000 [IU] | ORAL_CAPSULE | Freq: Three times a day (TID) | ORAL | Status: DC
  Administered 2021-08-28 – 2021-08-31 (×8): 24000 [IU] via ORAL
  Filled 2021-08-28 (×9): qty 2

## 2021-08-28 MED ORDER — HYDROXYZINE HCL 25 MG PO TABS
25.0000 mg | ORAL_TABLET | Freq: Three times a day (TID) | ORAL | Status: DC | PRN
Start: 2021-08-28 — End: 2021-08-31
  Administered 2021-08-28 – 2021-08-31 (×7): 25 mg via ORAL
  Filled 2021-08-28 (×7): qty 1

## 2021-08-28 MED ORDER — NICOTINE 21 MG/24HR TD PT24
21.0000 mg | MEDICATED_PATCH | Freq: Every day | TRANSDERMAL | Status: DC
  Administered 2021-08-28 – 2021-08-31 (×4): 21 mg via TRANSDERMAL
  Filled 2021-08-28 (×4): qty 1

## 2021-08-28 MED ORDER — FLUOXETINE HCL 20 MG PO CAPS
20.0000 mg | ORAL_CAPSULE | Freq: Every day | ORAL | Status: DC
  Administered 2021-08-29 – 2021-08-31 (×3): 20 mg via ORAL
  Filled 2021-08-28 (×3): qty 1

## 2021-08-28 MED ORDER — TRAZODONE HCL 50 MG PO TABS: 50.0000 mg | ORAL_TABLET | Freq: Every evening | ORAL | Status: DC | PRN

## 2021-08-28 MED ORDER — MAGNESIUM HYDROXIDE 400 MG/5ML PO SUSP
30.0000 mL | Freq: Every day | ORAL | Status: DC | PRN
Start: 2021-08-28 — End: 2021-08-31

## 2021-08-28 MED ORDER — ACETAMINOPHEN 325 MG PO TABS: 650.0000 mg | ORAL_TABLET | Freq: Four times a day (QID) | ORAL | Status: DC | PRN

## 2021-08-28 MED ORDER — HYDROXYZINE HCL 10 MG PO TABS
10.0000 mg | ORAL_TABLET | Freq: Three times a day (TID) | ORAL | Status: DC | PRN
Start: 2021-08-28 — End: 2021-08-28

## 2021-08-28 MED ORDER — ALUM & MAG HYDROXIDE-SIMETH 200-200-20 MG/5ML PO SUSP: 30.0000 mL | ORAL | Status: DC | PRN

## 2021-08-28 NOTE — BH Assessment (Signed)
Patient is to be admitted to [facility] by Dr. Toni Amend.  Attending Physician will be Dr.  Toni Amend .   Patient has been assigned to room 307, by [facility] Charge Nurse Tessia Kassin.    ER staff is aware of the admission: Luann, ER Secretary   Dr. Larinda Buttery, ER MD  Florentina Addison, Patient's Nurse  Sue Lush, Patient Access.

## 2021-08-28 NOTE — H&P (Signed)
Psychiatric Admission Assessment Adult  Patient Identification: Tamara Nguyen MRN:  [id number] Date of Evaluation:  08/28/2021 Chief Complaint:  Bipolar 1 disorder ([place]) [F31.9] Principal Diagnosis: Amphetamine and psychostimulant-induced psychosis with delusions ([place]) Diagnosis:  Principal Problem:   Amphetamine and psychostimulant-induced psychosis with delusions ([place]) Active Problems:   Episode of moderate major depression ([place])  History of Present Illness: Patient seen and chart reviewed.  41 year old woman known to the psychiatric service who has a history of amphetamine abuse and mood disorder.  Brought in under IVC filed by mother because the patient had not slept for days and was exhibiting psychotic symptoms.  In the emergency room the patient was agitated and disorganized and confused with inability to give a clear history.  Drug screen positive for amphetamines.  Today she is calm down quite a bit.  She is able to tell me that she had been in jail for about 4 months and when she got out she went back to stay with her mother.  She has felt stressed out and had trouble adapting.  Has not been able to work regularly.  She admits to having relapsed into methamphetamine use.  She claims that she only did it once but I do not think it is very reliable.  Denies any other drug use.  Has not been taking any psychiatric medicine or followed up with [facility].  Today the patient denies any auditory or visual hallucinations.  Denies suicidal thoughts.  Does not appear to be paranoid or obviously psychotic and certainly not manic Associated Signs/Symptoms: Depression Symptoms:  depressed mood, difficulty concentrating, anxiety, Duration of Depression Symptoms: No data recorded (Hypo) Manic Symptoms:  Flight of Ideas, Anxiety Symptoms:  Excessive Worry, Psychotic Symptoms:  Hallucinations: Auditory PTSD Symptoms: Negative Total Time spent with patient: 1 hour  Past Psychiatric History: Patient has a  history of presenting with psychotic symptoms when abusing stimulants that then tend to resolve spontaneously.  Has been diagnosed variously as depressed and bipolar in the past and has been on medication but has rarely been compliant with it.  Most of the manic episodes I can identify have been associated with stimulant abuse.  Is the patient at risk to self? Yes.    Has the patient been a risk to self in the past 6 months? Yes.    Has the patient been a risk to self within the distant past? Yes.    Is the patient a risk to others? No.  Has the patient been a risk to others in the past 6 months? No.  Has the patient been a risk to others within the distant past? No.   Prior Inpatient Therapy:   Prior Outpatient Therapy:    Alcohol Screening:   Substance Abuse History in the last 12 months:  Yes.   Consequences of Substance Abuse: Amphetamine and other stimulant abuse appears to be the primary problem driving her inability to think clearly or take care of herself Previous Psychotropic Medications: Yes  Psychological Evaluations: Yes  Past Medical History:  Past Medical History:  Diagnosis Date   Major depressive disorder     Past Surgical History:  Procedure Laterality Date   PANCREAS SURGERY     Family History: History reviewed. No pertinent family history. Family Psychiatric  History: None reported Tobacco Screening:   Social History:  Social History   Substance and Sexual Activity  Alcohol Use No     Social History   Substance and Sexual Activity  Drug Use Yes  Types: Methamphetamines    Additional Social History: Marital status: Married Number of Years Married: 3 What types of issues is patient dealing with in the relationship?: "He just got sentenced for 4 years of prison" Additional relationship information: Pt reports that while she has been married for 3 years, she has been with her husband for 10 years. Does patient have children?: Yes How many children?:  3 How is patient's relationship with their children?: "we're working on it"                         Allergies:  No Known Allergies Lab Results:  Results for orders placed or performed during the hospital encounter of 08/27/21 (from the past 48 hour(s))  Comprehensive metabolic panel     Status: Abnormal   Collection Time: 08/27/21 10:25 AM  Result Value Ref Range   Sodium 140 135 - 145 mmol/L   Potassium 3.7 3.5 - 5.1 mmol/L   Chloride 109 98 - 111 mmol/L   CO2 21 (L) 22 - 32 mmol/L   Glucose, Bld 100 (H) 70 - 99 mg/dL    Comment: Glucose reference range applies only to samples taken after fasting for at least 8 hours.   BUN 14 6 - 20 mg/dL   Creatinine, Ser 0.72 0.44 - 1.00 mg/dL   Calcium 8.3 (L) 8.9 - 10.3 mg/dL   Total Protein 7.1 6.5 - 8.1 g/dL   Albumin 3.7 3.5 - 5.0 g/dL   AST 20 15 - 41 U/L   ALT 16 0 - 44 U/L   Alkaline Phosphatase 62 38 - 126 U/L   Total Bilirubin 0.8 0.3 - 1.2 mg/dL   GFR, Estimated >60 >60 mL/min    Comment: (NOTE) Calculated using the CKD-EPI Creatinine Equation (2021)    Anion gap 10 5 - 15    Comment: Performed at [facility], [address] [place], [place] [address]  Ethanol     Status: None   Collection Time: 08/27/21 10:25 AM  Result Value Ref Range   Alcohol, Ethyl (B) <10 <10 mg/dL    Comment: (NOTE) Lowest detectable limit for serum alcohol is 10 mg/dL.  For medical purposes only. Performed at [facility], [address] [place], [place] [address]   Urine Drug Screen, Qualitative     Status: Abnormal   Collection Time: 08/27/21 10:25 AM  Result Value Ref Range   Tricyclic, Ur Screen NONE DETECTED NONE DETECTED   Amphetamines, Ur Screen POSITIVE (A) NONE DETECTED   MDMA (Ecstasy)Ur Screen NONE DETECTED NONE DETECTED   Cocaine Metabolite,Ur [place] NONE DETECTED NONE DETECTED   Opiate, Ur Screen NONE DETECTED NONE DETECTED   Phencyclidine (PCP) Ur S NONE DETECTED NONE DETECTED   Cannabinoid 50 Ng, Ur  [place] NONE DETECTED NONE DETECTED   Barbiturates, Ur Screen NONE DETECTED NONE DETECTED   Benzodiazepine, Ur Scrn NONE DETECTED NONE DETECTED   Methadone Scn, Ur NONE DETECTED NONE DETECTED    Comment: (NOTE) Tricyclics + metabolites, urine    Cutoff 1000 ng/mL Amphetamines + metabolites, urine  Cutoff 1000 ng/mL MDMA (Ecstasy), urine              Cutoff 500 ng/mL Cocaine Metabolite, urine          Cutoff 300 ng/mL Opiate + metabolites, urine        Cutoff 300 ng/mL Phencyclidine (PCP), urine         Cutoff 25 ng/mL Cannabinoid, urine  Cutoff 50 ng/mL Barbiturates + metabolites, urine  Cutoff 200 ng/mL Benzodiazepine, urine              Cutoff 200 ng/mL Methadone, urine                   Cutoff 300 ng/mL  The urine drug screen provides only a preliminary, unconfirmed analytical test result and should not be used for non-medical purposes. Clinical consideration and professional judgment should be applied to any positive drug screen result due to possible interfering substances. A more specific alternate chemical method must be used in order to obtain a confirmed analytical result. Gas chromatography / mass spectrometry (GC/MS) is the preferred confirm atory method. Performed at [facility], [address] [place], [place] [address]   CBC with Diff     Status: Abnormal   Collection Time: 08/27/21 10:25 AM  Result Value Ref Range   WBC 7.2 4.0 - 10.5 K/uL   RBC 3.86 (L) 3.87 - 5.11 MIL/uL   Hemoglobin 12.3 12.0 - 15.0 g/dL   HCT 60.441.1 54.036.0 - 98.146.0 %   MCV 106.5 (H) 80.0 - 100.0 fL   MCH 31.9 26.0 - 34.0 pg   MCHC 29.9 (L) 30.0 - 36.0 g/dL   RDW 19.114.8 47.811.5 - 29.515.5 %   Platelets 377 150 - 400 K/uL   nRBC 0.0 0.0 - 0.2 %   Neutrophils Relative % 66 %   Neutro Abs 4.9 1.7 - 7.7 K/uL   Lymphocytes Relative 18 %   Lymphs Abs 1.3 0.7 - 4.0 K/uL   Monocytes Relative 12 %   Monocytes Absolute 0.8 0.1 - 1.0 K/uL   Eosinophils Relative 3 %   Eosinophils Absolute  0.2 0.0 - 0.5 K/uL   Basophils Relative 1 %   Basophils Absolute 0.0 0.0 - 0.1 K/uL   Immature Granulocytes 0 %   Abs Immature Granulocytes 0.02 0.00 - 0.07 K/uL    Comment: Performed at [facility], [address] [place], [place] [address]  Acetaminophen level     Status: Abnormal   Collection Time: 08/27/21 10:25 AM  Result Value Ref Range   Acetaminophen (Tylenol), Serum <10 (L) 10 - 30 ug/mL    Comment: (NOTE) Therapeutic concentrations vary significantly. A range of 10-30 ug/mL  may be an effective concentration for many patients. However, some  are best treated at concentrations outside of this range. Acetaminophen concentrations >150 ug/mL at 4 hours after ingestion  and >50 ug/mL at 12 hours after ingestion are often associated with  toxic reactions.  Performed at [facility], [address] [place], [place] [address]   Salicylate level     Status: Abnormal   Collection Time: 08/27/21 10:25 AM  Result Value Ref Range   Salicylate Lvl <7.0 (L) 7.0 - 30.0 mg/dL    Comment: Performed at [facility], [address] [place], [place] [address]  SARS Coronavirus 2 by RT PCR (hospital order, performed in [facility] hospital lab) *cepheid single result test* Anterior Nasal Swab     Status: None   Collection Time: 08/27/21 10:25 AM   Specimen: Anterior Nasal Swab  Result Value Ref Range   SARS Coronavirus 2 by RT PCR NEGATIVE NEGATIVE    Comment: (NOTE) SARS-CoV-2 target nucleic acids are NOT DETECTED.  The SARS-CoV-2 RNA is generally detectable in upper and lower respiratory specimens during the acute phase of infection. The lowest concentration of SARS-CoV-2 viral copies this assay can detect is  250 copies / mL. A negative result does not preclude SARS-CoV-2 infection and should not be used as the sole basis for treatment or other patient management decisions.  A negative result may occur with improper specimen collection / handling,  submission of specimen other than nasopharyngeal swab, presence of viral mutation(s) within the areas targeted by this assay, and inadequate number of viral copies (<250 copies / mL). A negative result must be combined with clinical observations, patient history, and epidemiological information.  Fact Sheet for Patients:   https://www.patel.info/  Fact Sheet for Healthcare Providers: https://hall.com/  This test is not yet approved or  cleared by the [place] FDA and has been authorized for detection and/or diagnosis of SARS-CoV-2 by FDA under an Emergency Use Authorization (EUA).  This EUA will remain in effect (meaning this test can be used) for the duration of the COVID-19 declaration under Section 564(b)(1) of the Act, 21 U.S.C. section 360bbb-3(b)(1), unless the authorization is terminated or revoked sooner.  Performed at [facility], [address] [place], [place] [address]   POC urine preg, ED     Status: None   Collection Time: 08/27/21 10:31 AM  Result Value Ref Range   Preg Test, Ur NEGATIVE NEGATIVE    Comment:        THE SENSITIVITY OF THIS METHODOLOGY IS >24 mIU/mL     Blood Alcohol level:  Lab Results  Component Value Date   ETH <10 08/27/2021   ETH <10 99991111    Metabolic Disorder Labs:  Lab Results  Component Value Date   HGBA1C 5.4 01/03/2020   MPG 108 01/03/2020   No results found for: "PROLACTIN" Lab Results  Component Value Date   CHOL 200 01/03/2020   TRIG 152 (H) 01/03/2020   HDL 46 01/03/2020   CHOLHDL 4.3 01/03/2020   VLDL 30 01/03/2020   LDLCALC 124 (H) 01/03/2020    Current Medications: Current Facility-Administered Medications  Medication Dose Route Frequency Provider Last Rate Last Admin   acetaminophen (TYLENOL) tablet 650 mg  650 mg Oral Q6H PRN Sherlon Handing, NP       alum & mag hydroxide-simeth (MAALOX/MYLANTA) 200-200-20 MG/5ML suspension 30 mL  30 mL Oral  Q4H PRN Sherlon Handing, NP       [START ON 08/29/2021] FLUoxetine (PROZAC) capsule 20 mg  20 mg Oral Daily Vivion Romano, Madie Reno, MD       hydrOXYzine (ATARAX) tablet 25 mg  25 mg Oral TID PRN Orbin Mayeux T, MD   25 mg at 08/28/21 1546   lipase/protease/amylase (CREON) capsule 24,000 Units  24,000 Units Oral TID AC Jasmeet Gehl T, MD       magnesium hydroxide (MILK OF MAGNESIA) suspension 30 mL  30 mL Oral Daily PRN Waldon Merl F, NP       nicotine (NICODERM CQ - dosed in mg/24 hours) patch 21 mg  21 mg Transdermal Daily Nataleah Scioneaux T, MD   21 mg at 08/28/21 1544   traZODone (DESYREL) tablet 50 mg  50 mg Oral QHS PRN Sherlon Handing, NP       PTA Medications: Medications Prior to Admission  Medication Sig Dispense Refill Last Dose   CREON 3000-9500 units CPEP Take 1 capsule by mouth 3 (three) times daily. (Patient not taking: Reported on 09/28/2020)      FLUoxetine (PROZAC) 10 MG capsule Take 1 capsule by mouth daily. (Patient not taking: Reported on 08/27/2021)      hydrOXYzine (ATARAX/VISTARIL) 10 MG tablet  Take 1 tablet by mouth 3 (three) times daily as needed. (Patient not taking: Reported on 08/27/2021)      traZODone (DESYREL) 50 MG tablet Take 1 tablet (50 mg total) by mouth at bedtime as needed for sleep. (Patient not taking: Reported on 08/27/2021) 30 tablet 0     Musculoskeletal: Strength & Muscle Tone: within normal limits Gait & Station: normal Patient leans: N/A            Psychiatric Specialty Exam:  Presentation  General Appearance: Appropriate for Environment  Eye Contact:Fleeting  Speech:Clear and Coherent  Speech Volume:Normal  Handedness:Right   Mood and Affect  Mood:Anxious  Affect:Congruent   Thought Process  Thought Processes:Coherent  Duration of Psychotic Symptoms: No data recorded Past Diagnosis of Schizophrenia or Psychoactive disorder: No  Descriptions of Associations:Intact  Orientation:Full (Time, Place and  Person)  Thought Content:Scattered  Hallucinations:Hallucinations: None  Ideas of Reference:None  Suicidal Thoughts:Suicidal Thoughts: No  Homicidal Thoughts:Homicidal Thoughts: No   Sensorium  Memory:Immediate Good  Judgment:Poor  Insight:Poor   Executive Functions  Concentration:Fair  Attention Span:Fair  [place]   Psychomotor Activity  Psychomotor Activity:Psychomotor Activity: Increased   Assets  Assets:Financial Resources/Insurance; Social Support; Resilience   Sleep  Sleep:Sleep: Good    Physical Exam: Physical Exam Vitals and nursing note reviewed.  Constitutional:      Appearance: Normal appearance.  HENT:     Head: Normocephalic and atraumatic.     Mouth/Throat:     Pharynx: Oropharynx is clear.  Eyes:     Pupils: Pupils are equal, round, and reactive to light.  Cardiovascular:     Rate and Rhythm: Normal rate and regular rhythm.  Pulmonary:     Effort: Pulmonary effort is normal.     Breath sounds: Normal breath sounds.  Abdominal:     General: Abdomen is flat.     Palpations: Abdomen is soft.  Musculoskeletal:        General: Normal range of motion.  Skin:    General: Skin is warm and dry.  Neurological:     General: No focal deficit present.     Mental Status: She is alert. Mental status is at baseline.  Psychiatric:        Attention and Perception: She is inattentive. She does not perceive auditory hallucinations.        Mood and Affect: Mood is anxious.        Speech: Speech normal.        Behavior: Behavior is cooperative.        Thought Content: Thought content normal. Thought content does not include suicidal ideation.        Cognition and Memory: Memory is impaired.        Judgment: Judgment is inappropriate.    Review of Systems  Constitutional: Negative.   HENT: Negative.    Eyes: Negative.   Respiratory: Negative.    Cardiovascular: Negative.   Gastrointestinal:  Negative.   Musculoskeletal: Negative.   Skin: Negative.   Neurological: Negative.   Psychiatric/Behavioral:  Positive for depression, memory loss and substance abuse. Negative for hallucinations and suicidal ideas. The patient is nervous/anxious.    Blood pressure 98/71, pulse 71, temperature 98.4 F (36.9 C), temperature source Oral, resp. rate 18, height 5\' 1"  (1.549 m), weight 71.7 kg, SpO2 98 %. Body mass index is 29.85 kg/m.  Treatment Plan Summary: Medication management and Plan all things considered I think the fact that her mania and psychosis have  cleared up so quickly supports what I think is the diagnosis of primary substance abuse with dysphoria and depression and anxiety as well.  I am going to continue the Prozac and increase it to 20 mg.  Not going to add any other medication at this time.  Encourage patient to eat and drink attend groups or participate appropriately.  Likely discharge 1 to 2 days with [facility] at [facility]  Observation Level/Precautions:  15 minute checks  Laboratory:  UDS  Psychotherapy:    Medications:    Consultations:    Discharge Concerns:    Estimated LOS:  Other:     Physician Treatment Plan for Primary Diagnosis: Amphetamine and psychostimulant-induced psychosis with delusions ([place]) Long Term Goal(s): Improvement in symptoms so as ready for discharge  Short Term Goals: Ability to verbalize feelings will improve and Ability to demonstrate self-control will improve  Physician Treatment Plan for Secondary Diagnosis: Principal Problem:   Amphetamine and psychostimulant-induced psychosis with delusions ([place]) Active Problems:   Episode of moderate major depression ([place])  Long Term Goal(s): Improvement in symptoms so as ready for discharge  Short Term Goals: Compliance with prescribed medications will improve  I certify that inpatient services furnished can reasonably be expected to improve the patient's condition.    Alethia Berthold, MD 6/14/20234:08  PM

## 2021-08-28 NOTE — [facility] Suicide Risk Assessment (Signed)
[facility] INPATIENT:  Family/Significant Other Suicide Prevention Education  Suicide Prevention Education:  Contact Attempts: Chiquita Heckert, daughter, [phone], has been identified by the patient as the family member/significant other with whom the patient will be residing, and identified as the person(s) who will aid the patient in the event of a mental health crisis.  With written consent from the patient, two attempts were made to provide suicide prevention education, prior to and/or following the patient's discharge.  We were unsuccessful in providing suicide prevention education.  A suicide education pamphlet was given to the patient to share with family/significant other.  Date and time of first attempt:08/28/2021 at 3:50PM Date and time of second attempt:Second attempt is needed.  CSW left HIPPA compliant voicemail.  Harden Mo 08/28/2021, 3:52 PM

## 2021-08-28 NOTE — [facility] Suicide Risk Assessment (Signed)
[facility] Admission Suicide Risk Assessment   Nursing information obtained from:    Demographic factors:    Current Mental Status:    Loss Factors:    Historical Factors:    Risk Reduction Factors:     Total Time spent with patient: 1 hour Principal Problem: Amphetamine and psychostimulant-induced psychosis with delusions ([facility]) Diagnosis:  Principal Problem:   Amphetamine and psychostimulant-induced psychosis with delusions ([facility]) Active Problems:   Episode of moderate major depression ([facility])  Subjective Data: Patient seen and chart reviewed.  41 year old woman with a history of substance-induced psychosis.  On interview today she is denying all suicidal ideation.  Not reporting suicidal ideation on admission.  Currently calm no longer expressing psychotic symptoms and open to appropriate treatment  Continued Clinical Symptoms:    The "Alcohol Use Disorders Identification Test", Guidelines for Use in Primary Care, Second Edition.  World Science writer [facility]). Score between 0-7:  no or low risk or alcohol related problems. Score between 8-15:  moderate risk of alcohol related problems. Score between 16-19:  high risk of alcohol related problems. Score 20 or above:  warrants further diagnostic evaluation for alcohol dependence and treatment.   CLINICAL FACTORS:   Depression:   Comorbid alcohol abuse/dependence Alcohol/Substance Abuse/Dependencies   Musculoskeletal: Strength & Muscle Tone: within normal limits Gait & Station: normal Patient leans: N/A  Psychiatric Specialty Exam:  Presentation  General Appearance: Appropriate for Environment  Eye Contact:Fleeting  Speech:Clear and Coherent  Speech Volume:Normal  Handedness:Right   Mood and Affect  Mood:Anxious  Affect:Congruent   Thought Process  Thought Processes:Coherent  Descriptions of Associations:Intact  Orientation:Full (Time, Place and Person)  Thought Content:Scattered  History of  Schizophrenia/Schizoaffective disorder:No  Duration of Psychotic Symptoms:No data recorded Hallucinations:Hallucinations: None  Ideas of Reference:None  Suicidal Thoughts:Suicidal Thoughts: No  Homicidal Thoughts:Homicidal Thoughts: No   Sensorium  Memory:Immediate Good  Judgment:Poor  Insight:Poor   Executive Functions  Concentration:Fair  Attention Span:Fair  Recall:Fair  Fund of Knowledge:Fair  Language:Fair   Psychomotor Activity  Psychomotor Activity:Psychomotor Activity: Increased   Assets  Assets:Financial Resources/Insurance; Social Support; Resilience   Sleep  Sleep:Sleep: Good    Physical Exam: Physical Exam Vitals and nursing note reviewed.  Constitutional:      Appearance: Normal appearance.  HENT:     Head: Normocephalic and atraumatic.     Mouth/Throat:     Pharynx: Oropharynx is clear.  Eyes:     Pupils: Pupils are equal, round, and reactive to light.  Cardiovascular:     Rate and Rhythm: Normal rate and regular rhythm.  Pulmonary:     Effort: Pulmonary effort is normal.     Breath sounds: Normal breath sounds.  Abdominal:     General: Abdomen is flat.     Palpations: Abdomen is soft.  Musculoskeletal:        General: Normal range of motion.  Skin:    General: Skin is warm and dry.  Neurological:     General: No focal deficit present.     Mental Status: She is alert. Mental status is at baseline.  Psychiatric:        Attention and Perception: She is inattentive. She does not perceive auditory hallucinations.        Mood and Affect: Mood normal. Affect is blunt.        Speech: Speech normal.        Behavior: Behavior is cooperative.        Thought Content: Thought content normal.  Cognition and Memory: Memory is impaired.        Judgment: Judgment is impulsive.    Review of Systems  Constitutional: Negative.   HENT: Negative.    Eyes: Negative.   Respiratory: Negative.    Cardiovascular: Negative.    Gastrointestinal: Negative.   Musculoskeletal: Negative.   Skin: Negative.   Neurological: Negative.   Psychiatric/Behavioral:  Positive for depression, memory loss and substance abuse. Negative for hallucinations and suicidal ideas. The patient is not nervous/anxious and does not have insomnia.    Blood pressure 98/71, pulse 71, temperature 98.4 F (36.9 C), temperature source Oral, resp. rate 18, height 5\' 1"  (1.549 m), weight 71.7 kg, SpO2 98 %. Body mass index is 29.85 kg/m.   COGNITIVE FEATURES THAT CONTRIBUTE TO RISK:  None    SUICIDE RISK:   Minimal: No identifiable suicidal ideation.  Patients presenting with no risk factors but with morbid ruminations; may be classified as minimal risk based on the severity of the depressive symptoms  PLAN OF CARE: Continue current medication.  Engage in individual and group therapy.  Ongoing assessment for any signs of psychosis.  Work on ensuring appropriate referral to outpatient treatment.  Ongoing assessment of dangerousness prior to discharge  I certify that inpatient services furnished can reasonably be expected to improve the patient's condition.   , MD 08/28/2021, 4:06 PM

## 2021-08-28 NOTE — ED Notes (Signed)
Report called to BMU.

## 2021-08-28 NOTE — ED Notes (Signed)
Pt is A/Ox4 at time of discharge.  Pt denies any current SI/HI and stated she has not had any a/v hallucinations.  All pt belongings accounted for and given to [facility] security to transport to inpatient BMU.  Pt transported via wheelchair w/ [facility] staff x2
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
## 2021-08-28 NOTE — Plan of Care (Signed)
New admission.  Problem: Education: Goal: Knowledge of General Education information will improve Description: Including pain rating scale, medication(s)/side effects and non-pharmacologic comfort measures Outcome: Not Progressing   Problem: Health Behavior/Discharge Planning: Goal: Ability to manage health-related needs will improve Outcome: Not Progressing   Problem: Clinical Measurements: Goal: Ability to maintain clinical measurements within normal limits will improve Outcome: Not Progressing Goal: Will remain free from infection Outcome: Not Progressing Goal: Diagnostic test results will improve Outcome: Not Progressing Goal: Respiratory complications will improve Outcome: Not Progressing Goal: Cardiovascular complication will be avoided Outcome: Not Progressing   Problem: Activity: Goal: Risk for activity intolerance will decrease Outcome: Not Progressing   Problem: Nutrition: Goal: Adequate nutrition will be maintained Outcome: Not Progressing   Problem: Coping: Goal: Level of anxiety will decrease Outcome: Not Progressing   Problem: Elimination: Goal: Will not experience complications related to bowel motility Outcome: Not Progressing Goal: Will not experience complications related to urinary retention Outcome: Not Progressing   Problem: Pain Managment: Goal: General experience of comfort will improve Outcome: Not Progressing   Problem: Safety: Goal: Ability to remain free from injury will improve Outcome: Not Progressing   Problem: Skin Integrity: Goal: Risk for impaired skin integrity will decrease Outcome: Not Progressing   Problem: Education: Goal: Knowledge of [place] [facility] Education information/materials will improve Outcome: Not Progressing Goal: Emotional status will improve Outcome: Not Progressing Goal: Mental status will improve Outcome: Not Progressing Goal: Verbalization of understanding the information provided will  improve Outcome: Not Progressing   Problem: Safety: Goal: Periods of time without injury will increase Outcome: Not Progressing   Problem: Coping: Goal: Coping ability will improve Outcome: Not Progressing Goal: Will verbalize feelings Outcome: Not Progressing   Problem: Health Behavior/Discharge Planning: Goal: Compliance with therapeutic regimen will improve Outcome: Not Progressing   Problem: Self-Concept: Goal: Ability to identify factors that promote anxiety will improve Outcome: Not Progressing Goal: Level of anxiety will decrease Outcome: Not Progressing Goal: Ability to modify response to factors that promote anxiety will improve Outcome: Not Progressing

## 2021-08-28 NOTE — Progress Notes (Signed)
Admission Note:   Report was received from [facility], [place] on a 41 year-old female who presents IVC in no acute distress for the treatment of stimulant-induced Psychosis. Patient appears flat and depressed. Patient was calm and cooperative with admission process. Patient endorsed both depression and anxiety stating that "my husband just went to prison, we were trying to rebuild our lives and everything is all screwed up right now. I get upset because nobody wanted to help [place] when we needed it, now they won't leave [place] alone". Patient denies SI/HI/AVH and pain at the time of assessment. Patient's stressors are that she has some financial issues, she is staying with her Mother, and is unsure if she'll be able to return. Patient's goal for treatment is to "get my mind/mental issues evened out". Patient has no significant past medical history. Skin was assessed with Almedia Balls, RN and found to have freckles to her anterior chest and back, a tattoo to the middle/top of her back, neck area, calluses to the bottom of bilateral feet, a healed scar to her abdomen, from a past surgery, and a bruise to her right forearm. Patient searched and no contraband found and unit policies explained and understanding verbalized. Consents obtained. Food and fluids offered, and both accepted. Patient had no additional questions or concerns to voice at this time. Patient remains safe on the unit.

## 2021-08-28 NOTE — Tx Team (Addendum)
Initial Treatment Plan 08/28/2021 5:39 PM Tamara Nguyen [id number]    PATIENT STRESSORS: Financial difficulties   Marital or family conflict   Medication change or noncompliance     PATIENT STRENGTHS: Ability for insight  Marketing executive fund of knowledge  Motivation for treatment/growth    PATIENT IDENTIFIED PROBLEMS: Medication Noncompliance  Depression  Anxiety  Psychosis (stimulant induced)               DISCHARGE CRITERIA:  Ability to meet basic life and health needs Improved stabilization in mood, thinking, and/or behavior Need for constant or close observation no longer present  PRELIMINARY DISCHARGE PLAN: Outpatient therapy Return to previous living arrangement  PATIENT/FAMILY INVOLVEMENT: This treatment plan has been presented to and reviewed with the patient, Tamara Nguyen. The patient has been given the opportunity to ask questions and make suggestions.  Okie Bogacz, RN 08/28/2021, 5:39 PM

## 2021-08-28 NOTE — Group Note (Signed)
[facility] LCSW Group Therapy Note   Group Date: 08/28/2021 Start Time: 1300 End Time: 1400   Type of Therapy/Topic:  Group Therapy:  Emotion Regulation  Participation Level:  Did Not Attend   Mood:  Description of Group:    The purpose of this group is to assist patients in learning to regulate negative emotions and experience positive emotions. Patients will be guided to discuss ways in which they have been vulnerable to their negative emotions. These vulnerabilities will be juxtaposed with experiences of positive emotions or situations, and patients challenged to use positive emotions to combat negative ones. Special emphasis will be placed on coping with negative emotions in conflict situations, and patients will process healthy conflict resolution skills.  Therapeutic Goals: Patient will identify two positive emotions or experiences to reflect on in order to balance out negative emotions:  Patient will label two or more emotions that they find the most difficult to experience:  Patient will be able to demonstrate positive conflict resolution skills through discussion or role plays:   Summary of Patient Progress:   Group was offered, however, patient declined to attend.     Therapeutic Modalities:   Cognitive Behavioral Therapy Feelings Identification Dialectical Behavioral Therapy   Jenetta Wease J Jaevion Goto, LCSW 

## 2021-08-28 NOTE — ED Notes (Signed)
Report received from Kimberly S , RN including SBAR. On initial round after report Pt is warm/dry, resting quietly in room without any s/s of distress.  Will continue to monitor throughout shift as ordered for any changes in behaviors and for continued safety.   

## 2021-08-28 NOTE — Consult Note (Signed)
[facility] Psych ED Progress Note  08/28/2021 9:04 AM Tamara Nguyen  MRN:  [id number]   Method of visit?: Face to Face   Subjective: "My medicine isn't right"  Patient is more coherent this morning, but continues to have increase in psychomotor activity. She had no incidents of behavioral outbursts last night. She reports sleeping well. Today, she admits that she lives with her mother. She states she came to the hospital because she and her mother got into an argument. Patient denies illicit drug use, but when confronted with UDS she states "maybe I took a little meth." Patient reports seeing Dr. [place] Sink at [facility] and went on a new medicine for bipolar disorder 4 months ago, but she doesn't remember what it is.   Principal Problem: Bipolar I disorder, current or most recent episode manic, with psychotic features (HCC) Diagnosis:  Principal Problem:   Bipolar I disorder, current or most recent episode manic, with psychotic features (HCC)  Total Time spent with patient: 20 minutes  Past Psychiatric History: see previous  Past Medical History:  Past Medical History:  Diagnosis Date   Major depressive disorder     Past Surgical History:  Procedure Laterality Date   PANCREAS SURGERY     Family History: History reviewed. No pertinent family history. Family Psychiatric  History:  Social History:  Social History   Substance and Sexual Activity  Alcohol Use No     Social History   Substance and Sexual Activity  Drug Use Yes   Types: Methamphetamines    Social History   Socioeconomic History   Marital status: Single    Spouse name: Not on file   Number of children: Not on file   Years of education: Not on file   Highest education level: Not on file  Occupational History   Not on file  Tobacco Use   Smoking status: Every Day    Packs/day: 1.00    Types: Cigarettes   Smokeless tobacco: Never  Vaping Use   Vaping Use: Never used  Substance and Sexual Activity   Alcohol use: No    Drug use: Yes    Types: Methamphetamines   Sexual activity: Not Currently  Other Topics Concern   Not on file  Social History Narrative   Not on file   Social Determinants of Health   Financial Resource Strain: Not on file  Food Insecurity: Not on file  Transportation Needs: Not on file  Physical Activity: Not on file  Stress: Not on file  Social Connections: Not on file    Sleep: Good  Appetite:  Good  Current Medications: Current Facility-Administered Medications  Medication Dose Route Frequency Provider Last Rate Last Admin   FLUoxetine (PROZAC) capsule 10 mg  10 mg Oral Daily Gilles Chiquito, MD       hydrOXYzine (ATARAX) tablet 10 mg  10 mg Oral TID PRN Gilles Chiquito, MD       LORazepam (ATIVAN) tablet 1 mg  1 mg Oral Once Georga Hacking, MD       traZODone (DESYREL) tablet 50 mg  50 mg Oral QHS PRN Gilles Chiquito, MD       Current Outpatient Medications  Medication Sig Dispense Refill   CREON 3000-9500 units CPEP Take 1 capsule by mouth 3 (three) times daily. (Patient not taking: Reported on 09/28/2020)     FLUoxetine (PROZAC) 10 MG capsule Take 1 capsule by mouth daily. (Patient not taking: Reported on 08/27/2021)     hydrOXYzine (  ATARAX/VISTARIL) 10 MG tablet Take 1 tablet by mouth 3 (three) times daily as needed. (Patient not taking: Reported on 08/27/2021)     traZODone (DESYREL) 50 MG tablet Take 1 tablet (50 mg total) by mouth at bedtime as needed for sleep. (Patient not taking: Reported on 08/27/2021) 30 tablet 0    Lab Results:  Results for orders placed or performed during the hospital encounter of 08/27/21 (from the past 48 hour(s))  Comprehensive metabolic panel     Status: Abnormal   Collection Time: 08/27/21 10:25 AM  Result Value Ref Range   Sodium 140 135 - 145 mmol/L   Potassium 3.7 3.5 - 5.1 mmol/L   Chloride 109 98 - 111 mmol/L   CO2 21 (L) 22 - 32 mmol/L   Glucose, Bld 100 (H) 70 - 99 mg/dL    Comment: Glucose reference range applies  only to samples taken after fasting for at least 8 hours.   BUN 14 6 - 20 mg/dL   Creatinine, Ser 1.610.72 0.44 - 1.00 mg/dL   Calcium 8.3 (L) 8.9 - 10.3 mg/dL   Total Protein 7.1 6.5 - 8.1 g/dL   Albumin 3.7 3.5 - 5.0 g/dL   AST 20 15 - 41 U/L   ALT 16 0 - 44 U/L   Alkaline Phosphatase 62 38 - 126 U/L   Total Bilirubin 0.8 0.3 - 1.2 mg/dL   GFR, Estimated >09>60 >60>60 mL/min    Comment: (NOTE) Calculated using the CKD-EPI Creatinine Equation (2021)    Anion gap 10 5 - 15    Comment: Performed at [facility], [address] [place], [place] [address]  Ethanol     Status: None   Collection Time: 08/27/21 10:25 AM  Result Value Ref Range   Alcohol, Ethyl (B) <10 <10 mg/dL    Comment: (NOTE) Lowest detectable limit for serum alcohol is 10 mg/dL.  For medical purposes only. Performed at [facility], [address] [place], [place] [address]   Urine Drug Screen, Qualitative     Status: Abnormal   Collection Time: 08/27/21 10:25 AM  Result Value Ref Range   Tricyclic, Ur Screen NONE DETECTED NONE DETECTED   Amphetamines, Ur Screen POSITIVE (A) NONE DETECTED   MDMA (Ecstasy)Ur Screen NONE DETECTED NONE DETECTED   Cocaine Metabolite,Ur [place] NONE DETECTED NONE DETECTED   Opiate, Ur Screen NONE DETECTED NONE DETECTED   Phencyclidine (PCP) Ur S NONE DETECTED NONE DETECTED   Cannabinoid 50 Ng, Ur [place] NONE DETECTED NONE DETECTED   Barbiturates, Ur Screen NONE DETECTED NONE DETECTED   Benzodiazepine, Ur Scrn NONE DETECTED NONE DETECTED   Methadone Scn, Ur NONE DETECTED NONE DETECTED    Comment: (NOTE) Tricyclics + metabolites, urine    Cutoff 1000 ng/mL Amphetamines + metabolites, urine  Cutoff 1000 ng/mL MDMA (Ecstasy), urine              Cutoff 500 ng/mL Cocaine Metabolite, urine          Cutoff 300 ng/mL Opiate + metabolites, urine        Cutoff 300 ng/mL Phencyclidine (PCP), urine         Cutoff 25 ng/mL Cannabinoid, urine                 Cutoff 50 ng/mL Barbiturates  + metabolites, urine  Cutoff 200 ng/mL Benzodiazepine, urine              Cutoff 200 ng/mL Methadone, urine  Cutoff 300 ng/mL  The urine drug screen provides only a preliminary, unconfirmed analytical test result and should not be used for non-medical purposes. Clinical consideration and professional judgment should be applied to any positive drug screen result due to possible interfering substances. A more specific alternate chemical method must be used in order to obtain a confirmed analytical result. Gas chromatography / mass spectrometry (GC/MS) is the preferred confirm atory method. Performed at [facility], [address] [place], [place] [address]   CBC with Diff     Status: Abnormal   Collection Time: 08/27/21 10:25 AM  Result Value Ref Range   WBC 7.2 4.0 - 10.5 K/uL   RBC 3.86 (L) 3.87 - 5.11 MIL/uL   Hemoglobin 12.3 12.0 - 15.0 g/dL   HCT 60.4 54.0 - 98.1 %   MCV 106.5 (H) 80.0 - 100.0 fL   MCH 31.9 26.0 - 34.0 pg   MCHC 29.9 (L) 30.0 - 36.0 g/dL   RDW 19.1 47.8 - 29.5 %   Platelets 377 150 - 400 K/uL   nRBC 0.0 0.0 - 0.2 %   Neutrophils Relative % 66 %   Neutro Abs 4.9 1.7 - 7.7 K/uL   Lymphocytes Relative 18 %   Lymphs Abs 1.3 0.7 - 4.0 K/uL   Monocytes Relative 12 %   Monocytes Absolute 0.8 0.1 - 1.0 K/uL   Eosinophils Relative 3 %   Eosinophils Absolute 0.2 0.0 - 0.5 K/uL   Basophils Relative 1 %   Basophils Absolute 0.0 0.0 - 0.1 K/uL   Immature Granulocytes 0 %   Abs Immature Granulocytes 0.02 0.00 - 0.07 K/uL    Comment: Performed at [facility], [address] [place], [place] [address]  Acetaminophen level     Status: Abnormal   Collection Time: 08/27/21 10:25 AM  Result Value Ref Range   Acetaminophen (Tylenol), Serum <10 (L) 10 - 30 ug/mL    Comment: (NOTE) Therapeutic concentrations vary significantly. A range of 10-30 ug/mL  may be an effective concentration for many patients. However, some  are best  treated at concentrations outside of this range. Acetaminophen concentrations >150 ug/mL at 4 hours after ingestion  and >50 ug/mL at 12 hours after ingestion are often associated with  toxic reactions.  Performed at [facility], [address] [place], [place] [address]   Salicylate level     Status: Abnormal   Collection Time: 08/27/21 10:25 AM  Result Value Ref Range   Salicylate Lvl <7.0 (L) 7.0 - 30.0 mg/dL    Comment: Performed at [facility], [address] [place], [place] [address]  SARS Coronavirus 2 by RT PCR (hospital order, performed in [facility] hospital lab) *cepheid single result test* Anterior Nasal Swab     Status: None   Collection Time: 08/27/21 10:25 AM   Specimen: Anterior Nasal Swab  Result Value Ref Range   SARS Coronavirus 2 by RT PCR NEGATIVE NEGATIVE    Comment: (NOTE) SARS-CoV-2 target nucleic acids are NOT DETECTED.  The SARS-CoV-2 RNA is generally detectable in upper and lower respiratory specimens during the acute phase of infection. The lowest concentration of SARS-CoV-2 viral copies this assay can detect is 250 copies / mL. A negative result does not preclude SARS-CoV-2 infection and should not be used as the sole basis for treatment or other patient management decisions.  A negative result may occur with improper specimen collection / handling, submission of specimen other than nasopharyngeal swab, presence of  viral mutation(s) within the areas targeted by this assay, and inadequate number of viral copies (<250 copies / mL). A negative result must be combined with clinical observations, patient history, and epidemiological information.  Fact Sheet for Patients:   RoadLapTop.co.za  Fact Sheet for Healthcare Providers: http://kim-miller.com/  This test is not yet approved or  cleared by the [place] FDA and has been authorized for detection and/or diagnosis of SARS-CoV-2  by FDA under an Emergency Use Authorization (EUA).  This EUA will remain in effect (meaning this test can be used) for the duration of the COVID-19 declaration under Section 564(b)(1) of the Act, 21 U.S.C. section 360bbb-3(b)(1), unless the authorization is terminated or revoked sooner.  Performed at [facility], [address] [place], [place] [address]   POC urine preg, ED     Status: None   Collection Time: 08/27/21 10:31 AM  Result Value Ref Range   Preg Test, Ur NEGATIVE NEGATIVE    Comment:        THE SENSITIVITY OF THIS METHODOLOGY IS >24 mIU/mL     Blood Alcohol level:  Lab Results  Component Value Date   ETH <10 08/27/2021   ETH <10 09/28/2020    Physical Findings: AIMS:  , ,  ,  ,    CIWA:    COWS:     Musculoskeletal: Strength & Muscle Tone: within normal limits Gait & Station: normal Patient leans: N/A  Psychiatric Specialty Exam:  Presentation  General Appearance: Appropriate for Environment  Eye Contact:Fleeting  Speech:Clear and Coherent  Speech Volume:Normal  Handedness:Right   Mood and Affect  Mood:Anxious  Affect:Congruent   Thought Process  Thought Processes:Coherent  Descriptions of Associations:Intact  Orientation:Full (Time, Place and Person)  Thought Content:Scattered  History of Schizophrenia/Schizoaffective disorder:No  Duration of Psychotic Symptoms:No data recorded Hallucinations:Hallucinations: None  Ideas of Reference:None  Suicidal Thoughts:Suicidal Thoughts: No  Homicidal Thoughts:Homicidal Thoughts: No   Sensorium  Memory:Immediate Good  Judgment:Poor  Insight:Poor   Executive Functions  Concentration:Fair  Attention Span:Fair  Recall:Fair  Fund of Knowledge:Fair  Language:Fair   Psychomotor Activity  Psychomotor Activity:Psychomotor Activity: Increased   Assets  Assets:Financial Resources/Insurance; Social Support; Resilience   Sleep  Sleep:Sleep:  Good    Physical Exam: Physical Exam Vitals and nursing note reviewed.  HENT:     Head: Normocephalic.  Eyes:     General:        Right eye: No discharge.        Left eye: No discharge.  Cardiovascular:     Rate and Rhythm: Normal rate.  Pulmonary:     Effort: Pulmonary effort is normal.  Musculoskeletal:        General: Normal range of motion.     Cervical back: Normal range of motion.  Skin:    General: Skin is dry.  Neurological:     Mental Status: She is alert and oriented to person, place, and time.  Psychiatric:        Behavior: Behavior normal.    Review of Systems  Psychiatric/Behavioral:  Positive for depression and substance abuse.   All other systems reviewed and are negative.  Blood pressure 104/83, pulse 64, temperature 98.1 F (36.7 C), temperature source Oral, resp. rate 17, height  (1.549 m), weight 86.2 kg, SpO2 97 %. Body mass index is 35.9 kg/m.  Treatment Plan Summary: Daily contact with patient to assess and evaluate symptoms and progress in treatment, Medication management, and Plan Admit to inpatient psychiatry  Nickola Major  Kharlie Bring, NP 08/28/2021, 9:04 AM

## 2021-08-29 ENCOUNTER — Other Ambulatory Visit: Payer: Self-pay

## 2021-08-29 DIAGNOSIS — F15950 Other stimulant use, unspecified with stimulant-induced psychotic disorder with delusions: Secondary | ICD-10-CM | POA: Diagnosis not present

## 2021-08-29 MED ORDER — FLUOXETINE HCL 20 MG PO CAPS
20.0000 mg | ORAL_CAPSULE | Freq: Every day | ORAL | 0 refills | Status: DC
  Filled 2021-08-29: qty 10, 10d supply, fill #0

## 2021-08-29 MED ORDER — NICOTINE 21 MG/24HR TD PT24
21.0000 mg | MEDICATED_PATCH | Freq: Every day | TRANSDERMAL | 0 refills | Status: DC
  Filled 2021-08-29: qty 14, 14d supply, fill #0

## 2021-08-29 MED ORDER — PANCRELIPASE (LIP-PROT-AMYL) 24000-76000 UNITS PO CPEP
24000.0000 [IU] | ORAL_CAPSULE | Freq: Three times a day (TID) | ORAL | 0 refills | Status: DC
  Filled 2021-08-29: qty 30, 10d supply, fill #0

## 2021-08-29 MED ORDER — HYDROXYZINE HCL 25 MG PO TABS
25.0000 mg | ORAL_TABLET | Freq: Three times a day (TID) | ORAL | 0 refills | Status: DC | PRN
  Filled 2021-08-29: qty 20, 7d supply, fill #0

## 2021-08-29 NOTE — Progress Notes (Signed)
Patient isolative to her room this shift denies SI/HI/A/VH and verbally contracted for safety. Q 15 minutes safety checks ongoing. Patient remains safe.

## 2021-08-29 NOTE — Plan of Care (Signed)
  Problem: Health Behavior/Discharge Planning: Goal: Ability to manage health-related needs will improve Outcome: Progressing   Problem: Clinical Measurements: Goal: Ability to maintain clinical measurements within normal limits will improve Outcome: Progressing   Problem: Coping: Goal: Level of anxiety will decrease Outcome: Progressing  Patient interacting well with Peers and Staff endorses anxiety and depression. Support and encouragement provided.

## 2021-08-29 NOTE — Progress Notes (Signed)
Patient is cooperative with assessment. She denies SI, HI, and AVH. She rates depression as a 10/10 and anxiety as an 8/10. Patient was given Vistaril PRN at 1001. Patient reports poor sleep, stating that she feels she cannot get a deep sleep and wakes up easily. Patient came to the dayroom for breakfast and returned to her room. She is compliant with scheduled medications. Patient remains safe on the unit at this time.

## 2021-08-29 NOTE — Progress Notes (Signed)
[facility] MD Progress Note  08/29/2021 3:52 PM Tamara Nguyen  MRN:  [id number] Subjective: Follow-up 41 year old woman with amphetamine abuse.  Patient denies current suicidal thoughts.  Denies active psychosis.  Continues to feel anxious.  Discusses how she really has no clear place to go.  Mother wants her to go to "long-term" treatment which the patient realizes is unlikely and does not really want to do anyway.  No new physical complaints.  No evidence of psychosis no hallucinations. Principal Problem: Amphetamine and psychostimulant-induced psychosis with delusions (HCC) Diagnosis: Principal Problem:   Amphetamine and psychostimulant-induced psychosis with delusions (HCC) Active Problems:   Episode of moderate major depression (HCC)  Total Time spent with patient: 30 minutes  Past Psychiatric History: Past history of recurrent mood symptoms often manicky with psychotic symptoms especially when abusing stimulants  Past Medical History:  Past Medical History:  Diagnosis Date   Major depressive disorder     Past Surgical History:  Procedure Laterality Date   PANCREAS SURGERY     Family History: History reviewed. No pertinent family history. Family Psychiatric  History: See previous Social History:  Social History   Substance and Sexual Activity  Alcohol Use No     Social History   Substance and Sexual Activity  Drug Use Yes   Types: Methamphetamines    Social History   Socioeconomic History   Marital status: Single    Spouse name: Not on file   Number of children: Not on file   Years of education: Not on file   Highest education level: Not on file  Occupational History   Not on file  Tobacco Use   Smoking status: Every Day    Packs/day: 1.00    Types: Cigarettes   Smokeless tobacco: Never  Vaping Use   Vaping Use: Never used  Substance and Sexual Activity   Alcohol use: No   Drug use: Yes    Types: Methamphetamines   Sexual activity: Not Currently  Other  Topics Concern   Not on file  Social History Narrative   Not on file   Social Determinants of Health   Financial Resource Strain: Not on file  Food Insecurity: Not on file  Transportation Needs: Not on file  Physical Activity: Not on file  Stress: Not on file  Social Connections: Not on file   Additional Social History:                         Sleep: Fair  Appetite:  Fair  Current Medications: Current Facility-Administered Medications  Medication Dose Route Frequency Provider Last Rate Last Admin   acetaminophen (TYLENOL) tablet 650 mg  650 mg Oral Q6H PRN Vanetta Mulders, NP       alum & mag hydroxide-simeth (MAALOX/MYLANTA) 200-200-20 MG/5ML suspension 30 mL  30 mL Oral Q4H PRN Gabriel Cirri F, NP       FLUoxetine (PROZAC) capsule 20 mg  20 mg Oral Daily Axil Copeman, Jackquline Denmark, MD   20 mg at 08/29/21 0747   hydrOXYzine (ATARAX) tablet 25 mg  25 mg Oral TID PRN Stephaney Steven, Jackquline Denmark, MD   25 mg at 08/29/21 1001   lipase/protease/amylase (CREON) capsule 24,000 Units  24,000 Units Oral TID AC Rashan Rounsaville, Jackquline Denmark, MD   24,000 Units at 08/29/21 1144   magnesium hydroxide (MILK OF MAGNESIA) suspension 30 mL  30 mL Oral Daily PRN Gabriel Cirri F, NP       nicotine (NICODERM CQ - dosed  in mg/24 hours) patch 21 mg  21 mg Transdermal Daily Benji Poynter, Jackquline Denmark, MD   21 mg at 08/29/21 0748   traZODone (DESYREL) tablet 50 mg  50 mg Oral QHS PRN Vanetta Mulders, NP        Lab Results: No results found for this or any previous visit (from the past 48 hour(s)).  Blood Alcohol level:  Lab Results  Component Value Date   ETH <10 08/27/2021   ETH <10 09/28/2020    Metabolic Disorder Labs: Lab Results  Component Value Date   HGBA1C 5.4 01/03/2020   MPG 108 01/03/2020   No results found for: "PROLACTIN" Lab Results  Component Value Date   CHOL 200 01/03/2020   TRIG 152 (H) 01/03/2020   HDL 46 01/03/2020   CHOLHDL 4.3 01/03/2020   VLDL 30 01/03/2020   LDLCALC 124 (H) 01/03/2020     Physical Findings: AIMS:  , ,  ,  ,    CIWA:    COWS:     Musculoskeletal: Strength & Muscle Tone: within normal limits Gait & Station: normal Patient leans: N/A  Psychiatric Specialty Exam:  Presentation  General Appearance: Appropriate for Environment  Eye Contact:Fleeting  Speech:Clear and Coherent  Speech Volume:Normal  Handedness:Right   Mood and Affect  Mood:Anxious  Affect:Congruent   Thought Process  Thought Processes:Coherent  Descriptions of Associations:Intact  Orientation:Full (Time, Place and Person)  Thought Content:Scattered  History of Schizophrenia/Schizoaffective disorder:No  Duration of Psychotic Symptoms:No data recorded Hallucinations:Hallucinations: None  Ideas of Reference:None  Suicidal Thoughts:Suicidal Thoughts: No  Homicidal Thoughts:Homicidal Thoughts: No   Sensorium  Memory:Immediate Good  Judgment:Poor  Insight:Poor   Executive Functions  Concentration:Fair  Attention Span:Fair  Recall:Fair  Fund of Knowledge:Fair  Language:Fair   Psychomotor Activity  Psychomotor Activity:Psychomotor Activity: Increased   Assets  Assets:Financial Resources/Insurance; Social Support; Resilience   Sleep  Sleep:Sleep: Good    Physical Exam: Physical Exam Vitals and nursing note reviewed.  Constitutional:      Appearance: Normal appearance.  HENT:     Head: Normocephalic and atraumatic.     Mouth/Throat:     Pharynx: Oropharynx is clear.  Eyes:     Pupils: Pupils are equal, round, and reactive to light.  Cardiovascular:     Rate and Rhythm: Normal rate and regular rhythm.  Pulmonary:     Effort: Pulmonary effort is normal.     Breath sounds: Normal breath sounds.  Abdominal:     General: Abdomen is flat.     Palpations: Abdomen is soft.  Musculoskeletal:        General: Normal range of motion.  Skin:    General: Skin is warm and dry.  Neurological:     General: No focal deficit present.      Mental Status: She is alert. Mental status is at baseline.  Psychiatric:        Mood and Affect: Mood is anxious.        Speech: Speech normal.        Behavior: Behavior is cooperative.        Thought Content: Thought content normal.        Cognition and Memory: Cognition normal.        Judgment: Judgment normal.    Review of Systems  Constitutional: Negative.   HENT: Negative.    Eyes: Negative.   Respiratory: Negative.    Cardiovascular: Negative.   Gastrointestinal: Negative.   Musculoskeletal: Negative.   Skin: Negative.   Neurological: Negative.  Psychiatric/Behavioral:  Positive for depression and substance abuse. Negative for hallucinations and suicidal ideas. The patient is nervous/anxious.    Blood pressure 111/78, pulse 73, temperature 98.5 F (36.9 C), temperature source Oral, resp. rate 18, height 5\' 1"  (1.549 m), weight 71.7 kg, SpO2 100 %. Body mass index is 29.85 kg/m.   Treatment Plan Summary: Medication management and Plan no change to current medication management.  Patient's primary barrier to discharge is homelessness.  Encourage patient to continue talking with her mother about the possibility of coming home.  Meanwhile continue attendance at group meetings and interaction on the unit.  , MD 08/29/2021, 3:52 PM
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
## 2021-08-29 NOTE — Group Note (Signed)
[facility] LCSW Group Therapy Note   Group Date: 08/29/2021 Start Time: 1300 End Time: 1400   Type of Therapy/Topic:  Group Therapy:  Balance in Life  Participation Level:  Did Not Attend   Description of Group:    This group will address the concept of balance and how it feels and looks when one is unbalanced. Patients will be encouraged to process areas in their lives that are out of balance, and identify reasons for remaining unbalanced. Facilitators will guide patients utilizing problem- solving interventions to address and correct the stressor making their life unbalanced. Understanding and applying boundaries will be explored and addressed for obtaining  and maintaining a balanced life. Patients will be encouraged to explore ways to assertively make their unbalanced needs known to significant others in their lives, using other group members and facilitator for support and feedback.  Therapeutic Goals: Patient will identify two or more emotions or situations they have that consume much of in their lives. Patient will identify signs/triggers that life has become out of balance:  Patient will identify two ways to set boundaries in order to achieve balance in their lives:  Patient will demonstrate ability to communicate their needs through discussion and/or role plays  Summary of Patient Progress: Invitation for group was extended but pt declined to attend.  Therapeutic Modalities:   Cognitive Behavioral Therapy Solution-Focused Therapy Assertiveness Training   Alizah Sills R Leightyn Cina, LCSW 

## 2021-08-29 NOTE — Progress Notes (Signed)
Recreation Therapy Notes  Date: 08/29/2021  Time: 10:40 am    Location: Courtyard    Behavioral response: N/A   Intervention Topic: Social Skills    Discussion/Intervention: Patient refused to attend group.   Clinical Observations/Feedback:  Patient refused to attend group.    Ebelin Dillehay LRT/CTRS        Oshen Wlodarczyk 08/29/2021 12:59 PM

## 2021-08-30 DIAGNOSIS — F15950 Other stimulant use, unspecified with stimulant-induced psychotic disorder with delusions: Secondary | ICD-10-CM | POA: Diagnosis not present

## 2021-08-30 MED ORDER — FLUOXETINE HCL 20 MG PO CAPS: 20.0000 mg | ORAL_CAPSULE | Freq: Every day | ORAL | 1 refills | Status: AC

## 2021-08-30 MED ORDER — NICOTINE 21 MG/24HR TD PT24
21.0000 mg | MEDICATED_PATCH | Freq: Every day | TRANSDERMAL | 1 refills | Status: AC
Start: 2021-08-30 — End: ?

## 2021-08-30 MED ORDER — HYDROXYZINE HCL 25 MG PO TABS
25.0000 mg | ORAL_TABLET | Freq: Three times a day (TID) | ORAL | 1 refills | Status: AC | PRN
Start: 2021-08-30 — End: ?

## 2021-08-30 MED ORDER — PANCRELIPASE (LIP-PROT-AMYL) 24000-76000 UNITS PO CPEP
24000.0000 [IU] | ORAL_CAPSULE | Freq: Three times a day (TID) | ORAL | 1 refills | Status: AC
Start: 2021-08-30 — End: ?

## 2021-08-30 NOTE — Progress Notes (Signed)
[facility] MD Progress Note  08/30/2021 2:40 PM Tamara Nguyen  MRN:  [id number] Subjective: Follow-up patient with amphetamine abuse and depression.  Mood is stated as much better.  Affect euthymic and appropriately reactive.  No sign of psychosis today.  Good insight.  She met with treatment team and met with me individually.  Patient had considered the possibility of rehab but prefers to go to [facility] for substance abuse intensive outpatient.  She has now made an agreement with her mother that she can return there tomorrow Principal Problem: Amphetamine and psychostimulant-induced psychosis with delusions ([place]) Diagnosis: Principal Problem:   Amphetamine and psychostimulant-induced psychosis with delusions ([place]) Active Problems:   Episode of moderate major depression ([place])  Total Time spent with patient: 30 minutes  Past Psychiatric History: Past history of recurrent amphetamine abuse with behavior problems mood swings and psychosis as a result  Past Medical History:  Past Medical History:  Diagnosis Date   Major depressive disorder     Past Surgical History:  Procedure Laterality Date   PANCREAS SURGERY     Family History: History reviewed. No pertinent family history. Family Psychiatric  History: See previous Social History:  Social History   Substance and Sexual Activity  Alcohol Use No     Social History   Substance and Sexual Activity  Drug Use Yes   Types: Methamphetamines    Social History   Socioeconomic History   Marital status: Single    Spouse name: Not on file   Number of children: Not on file   Years of education: Not on file   Highest education level: Not on file  Occupational History   Not on file  Tobacco Use   Smoking status: Every Day    Packs/day: 1.00    Types: Cigarettes   Smokeless tobacco: Never  Vaping Use   Vaping Use: Never used  Substance and Sexual Activity   Alcohol use: No   Drug use: Yes    Types: Methamphetamines   Sexual activity:  Not Currently  Other Topics Concern   Not on file  Social History Narrative   Not on file   Social Determinants of Health   Financial Resource Strain: Not on file  Food Insecurity: Not on file  Transportation Needs: Not on file  Physical Activity: Not on file  Stress: Not on file  Social Connections: Not on file   Additional Social History:                         Sleep: Fair  Appetite:  Fair  Current Medications: Current Facility-Administered Medications  Medication Dose Route Frequency Provider Last Rate Last Admin   acetaminophen (TYLENOL) tablet 650 mg  650 mg Oral Q6H PRN Sherlon Handing, NP       alum & mag hydroxide-simeth (MAALOX/MYLANTA) 200-200-20 MG/5ML suspension 30 mL  30 mL Oral Q4H PRN Waldon Merl F, NP       FLUoxetine (PROZAC) capsule 20 mg  20 mg Oral Daily Kaymon Denomme T, MD   20 mg at 08/30/21 0805   hydrOXYzine (ATARAX) tablet 25 mg  25 mg Oral TID PRN Marvetta Vohs, Madie Reno, MD   25 mg at 08/30/21 0805   lipase/protease/amylase (CREON) capsule 24,000 Units  24,000 Units Oral TID AC Adlynn Lowenstein, Madie Reno, MD   24,000 Units at 08/30/21 1147   magnesium hydroxide (MILK OF MAGNESIA) suspension 30 mL  30 mL Oral Daily PRN Sherlon Handing, NP  nicotine (NICODERM CQ - dosed in mg/24 hours) patch 21 mg  21 mg Transdermal Daily Emmitt Matthews T, MD   21 mg at 08/30/21 0805   traZODone (DESYREL) tablet 50 mg  50 mg Oral QHS PRN Sherlon Handing, NP        Lab Results: No results found for this or any previous visit (from the past 48 hour(s)).  Blood Alcohol level:  Lab Results  Component Value Date   ETH <10 08/27/2021   ETH <10 03/83/3383    Metabolic Disorder Labs: Lab Results  Component Value Date   HGBA1C 5.4 01/03/2020   MPG 108 01/03/2020   No results found for: "PROLACTIN" Lab Results  Component Value Date   CHOL 200 01/03/2020   TRIG 152 (H) 01/03/2020   HDL 46 01/03/2020   CHOLHDL 4.3 01/03/2020   VLDL 30 01/03/2020    LDLCALC 124 (H) 01/03/2020    Physical Findings: AIMS: Facial and Oral Movements Muscles of Facial Expression: None, normal Lips and Perioral Area: None, normal Jaw: None, normal Tongue: None, normal,Extremity Movements Upper (arms, wrists, hands, fingers): None, normal Lower (legs, knees, ankles, toes): None, normal, Trunk Movements Neck, shoulders, hips: None, normal, Overall Severity Severity of abnormal movements (highest score from questions above): None, normal Incapacitation due to abnormal movements: None, normal Patient's awareness of abnormal movements (rate only patient's report): No Awareness, Dental Status Current problems with teeth and/or dentures?: No Does patient usually wear dentures?: No  CIWA:    COWS:     Musculoskeletal: Strength & Muscle Tone: within normal limits Gait & Station: normal Patient leans: N/A  Psychiatric Specialty Exam:  Presentation  General Appearance: Appropriate for Environment  Eye Contact:Fleeting  Speech:Clear and Coherent  Speech Volume:Normal  Handedness:Right   Mood and Affect  Mood:Anxious  Affect:Congruent   Thought Process  Thought Processes:Coherent  Descriptions of Associations:Intact  Orientation:Full (Time, Place and Person)  Thought Content:Scattered  History of Schizophrenia/Schizoaffective disorder:No  Duration of Psychotic Symptoms:No data recorded Hallucinations:No data recorded Ideas of Reference:None  Suicidal Thoughts:No data recorded Homicidal Thoughts:No data recorded  Sensorium  Memory:Immediate Good  Judgment:Poor  Insight:Poor   Executive Functions  Concentration:Fair  Attention Span:Fair  [place]   Psychomotor Activity  Psychomotor Activity:No data recorded  Assets  Assets:Financial Resources/Insurance; Social Support; Resilience   Sleep  Sleep:No data recorded   Physical Exam: Physical Exam Vitals reviewed.   Constitutional:      Appearance: Normal appearance.  HENT:     Head: Normocephalic and atraumatic.     Mouth/Throat:     Pharynx: Oropharynx is clear.  Eyes:     Pupils: Pupils are equal, round, and reactive to light.  Cardiovascular:     Rate and Rhythm: Normal rate and regular rhythm.  Pulmonary:     Effort: Pulmonary effort is normal.     Breath sounds: Normal breath sounds.  Abdominal:     General: Abdomen is flat.     Palpations: Abdomen is soft.  Musculoskeletal:        General: Normal range of motion.  Skin:    General: Skin is warm and dry.  Neurological:     General: No focal deficit present.     Mental Status: She is alert. Mental status is at baseline.  Psychiatric:        Attention and Perception: Attention normal.        Mood and Affect: Mood normal.        Speech:  Speech normal.        Behavior: Behavior is cooperative.        Thought Content: Thought content normal.        Cognition and Memory: Cognition normal.        Judgment: Judgment normal.    Review of Systems  Constitutional: Negative.   HENT: Negative.    Eyes: Negative.   Respiratory: Negative.    Cardiovascular: Negative.   Gastrointestinal: Negative.   Musculoskeletal: Negative.   Skin: Negative.   Neurological: Negative.   Psychiatric/Behavioral: Negative.     Blood pressure 111/71, pulse 74, temperature 98.3 F (36.8 C), temperature source Oral, resp. rate 18, height 5' 1" (1.549 m), weight 71.7 kg, SpO2 98 %. Body mass index is 29.85 kg/m.   Treatment Plan Summary: Plan no change to medication.  Patient says that her mother will pick her up tomorrow.  We will make preparations for discharge over the weekend preparing prescriptions for her.  Patient agrees to plan to follow up with [facility]  Alethia Berthold, MD 08/30/2021, 2:40 PM

## 2021-08-30 NOTE — [facility] Suicide Risk Assessment (Signed)
[facility] Discharge Suicide Risk Assessment   Principal Problem: Amphetamine and psychostimulant-induced psychosis with delusions (HCC) Discharge Diagnoses: Principal Problem:   Amphetamine and psychostimulant-induced psychosis with delusions (HCC) Active Problems:   Episode of moderate major depression (HCC)   Total Time spent with patient: 30 minutes  Musculoskeletal: Strength & Muscle Tone: within normal limits Gait & Station: normal Patient leans: N/A  Psychiatric Specialty Exam  Presentation  General Appearance: Appropriate for Environment  Eye Contact:Fleeting  Speech:Clear and Coherent  Speech Volume:Normal  Handedness:Right   Mood and Affect  Mood:Anxious  Duration of Depression Symptoms: No data recorded Affect:Congruent   Thought Process  Thought Processes:Coherent  Descriptions of Associations:Intact  Orientation:Full (Time, Place and Person)  Thought Content:Scattered  History of Schizophrenia/Schizoaffective disorder:No  Duration of Psychotic Symptoms:No data recorded Hallucinations:No data recorded Ideas of Reference:None  Suicidal Thoughts:No data recorded Homicidal Thoughts:No data recorded  Sensorium  Memory:Immediate Good  Judgment:Poor  Insight:Poor   Executive Functions  Concentration:Fair  Attention Span:Fair  Recall:Fair  Fund of Knowledge:Fair  Language:Fair   Psychomotor Activity  Psychomotor Activity:No data recorded  Assets  Assets:Financial Resources/Insurance; Social Support; Resilience   Sleep  Sleep:No data recorded  Physical Exam: Physical Exam Vitals and nursing note reviewed.  Constitutional:      Appearance: Normal appearance.  HENT:     Head: Normocephalic and atraumatic.     Mouth/Throat:     Pharynx: Oropharynx is clear.  Eyes:     Pupils: Pupils are equal, round, and reactive to light.  Cardiovascular:     Rate and Rhythm: Normal rate and regular rhythm.  Pulmonary:     Effort:  Pulmonary effort is normal.     Breath sounds: Normal breath sounds.  Abdominal:     General: Abdomen is flat.     Palpations: Abdomen is soft.  Musculoskeletal:        General: Normal range of motion.  Skin:    General: Skin is warm and dry.  Neurological:     General: No focal deficit present.     Mental Status: She is alert. Mental status is at baseline.  Psychiatric:        Attention and Perception: Attention normal.        Mood and Affect: Mood normal.        Speech: Speech normal.        Behavior: Behavior normal.        Thought Content: Thought content normal.        Cognition and Memory: Cognition normal.        Judgment: Judgment normal.    Review of Systems  Constitutional: Negative.   HENT: Negative.    Eyes: Negative.   Respiratory: Negative.    Cardiovascular: Negative.   Gastrointestinal: Negative.   Musculoskeletal: Negative.   Skin: Negative.   Neurological: Negative.   Psychiatric/Behavioral: Negative.     Blood pressure 111/71, pulse 74, temperature 98.3 F (36.8 C), temperature source Oral, resp. rate 18, height 5\' 1"  (1.549 m), weight 71.7 kg, SpO2 98 %. Body mass index is 29.85 kg/m.  Mental Status Per Nursing Assessment::   On Admission:  NA  Demographic Factors:  Caucasian and Low socioeconomic status  Loss Factors: Legal issues  Historical Factors: Impulsivity  Risk Reduction Factors:   Living with another person, especially a relative and Positive therapeutic relationship  Continued Clinical Symptoms:  Depression:   Comorbid alcohol abuse/dependence Alcohol/Substance Abuse/Dependencies  Cognitive Features That Contribute To Risk:  None    Suicide Risk:  Minimal: No identifiable suicidal ideation.  Patients presenting with no risk factors but with morbid ruminations; may be classified as minimal risk based on the severity of the depressive symptoms   Follow-up Information     [facility] Follow up.   Why: Your  appointment is scheduled for Wednesday, 09/04/21. You will meet Lorella Nimrod, peer support specialist, there at 7:30AM. Thanks! Contact information: [address] [place] [address] [phone]                 Plan Of Care/Follow-up recommendations:  Patient is denying all suicidal ideation and is behaving safely with plans for the future.  She is agreeable to outpatient treatment with [facility].  Plan is for discharge tomorrow and follow-up with intensive outpatient program.  Mordecai Rasmussen, MD 08/30/2021, 2:43 PM

## 2021-08-30 NOTE — BH IP Treatment Plan (Signed)
Interdisciplinary Treatment and Diagnostic Plan Update  08/30/2021 Time of Session: 9:30AM Tamara Nguyen MRN: [id number]  Principal Diagnosis: Amphetamine and psychostimulant-induced psychosis with delusions ([place])  Secondary Diagnoses: Principal Problem:   Amphetamine and psychostimulant-induced psychosis with delusions ([place]) Active Problems:   Episode of moderate major depression ([place])   Current Medications:  Current Facility-Administered Medications  Medication Dose Route Frequency Provider Last Rate Last Admin   acetaminophen (TYLENOL) tablet 650 mg  650 mg Oral Q6H PRN Sherlon Handing, NP       alum & mag hydroxide-simeth (MAALOX/MYLANTA) 200-200-20 MG/5ML suspension 30 mL  30 mL Oral Q4H PRN Waldon Merl F, NP       FLUoxetine (PROZAC) capsule 20 mg  20 mg Oral Daily Clapacs, John T, MD   20 mg at 08/30/21 0805   hydrOXYzine (ATARAX) tablet 25 mg  25 mg Oral TID PRN Clapacs, Madie Reno, MD   25 mg at 08/30/21 0805   lipase/protease/amylase (CREON) capsule 24,000 Units  24,000 Units Oral TID AC Clapacs, John T, MD   24,000 Units at 08/30/21 0806   magnesium hydroxide (MILK OF MAGNESIA) suspension 30 mL  30 mL Oral Daily PRN Waldon Merl F, NP       nicotine (NICODERM CQ - dosed in mg/24 hours) patch 21 mg  21 mg Transdermal Daily Clapacs, John T, MD   21 mg at 08/30/21 0805   traZODone (DESYREL) tablet 50 mg  50 mg Oral QHS PRN Sherlon Handing, NP       PTA Medications: Medications Prior to Admission  Medication Sig Dispense Refill Last Dose   CREON 3000-9500 units CPEP Take 1 capsule by mouth 3 (three) times daily. (Patient not taking: Reported on 09/28/2020)      FLUoxetine (PROZAC) 10 MG capsule Take 1 capsule by mouth daily. (Patient not taking: Reported on 08/27/2021)      hydrOXYzine (ATARAX/VISTARIL) 10 MG tablet Take 1 tablet by mouth 3 (three) times daily as needed. (Patient not taking: Reported on 08/27/2021)      traZODone (DESYREL) 50 MG tablet Take 1 tablet  (50 mg total) by mouth at bedtime as needed for sleep. (Patient not taking: Reported on 08/27/2021) 30 tablet 0     Patient Stressors: Financial difficulties   Marital or family conflict   Medication change or noncompliance    Patient Strengths: Ability for insight  Marketing executive fund of knowledge  Motivation for treatment/growth   Treatment Modalities: Medication Management, Group therapy, Case management,  1 to 1 session with clinician, Psychoeducation, Recreational therapy.   Physician Treatment Plan for Primary Diagnosis: Amphetamine and psychostimulant-induced psychosis with delusions ([place]) Long Term Goal(s): Improvement in symptoms so as ready for discharge   Short Term Goals: Compliance with prescribed medications will improve Ability to verbalize feelings will improve Ability to demonstrate self-control will improve  Medication Management: Evaluate patient's response, side effects, and tolerance of medication regimen.  Therapeutic Interventions: 1 to 1 sessions, Unit Group sessions and Medication administration.  Evaluation of Outcomes: Progressing  Physician Treatment Plan for Secondary Diagnosis: Principal Problem:   Amphetamine and psychostimulant-induced psychosis with delusions ([place]) Active Problems:   Episode of moderate major depression ([place])  Long Term Goal(s): Improvement in symptoms so as ready for discharge   Short Term Goals: Compliance with prescribed medications will improve Ability to verbalize feelings will improve Ability to demonstrate self-control will improve     Medication Management: Evaluate patient's response, side effects, and tolerance of medication regimen.  Therapeutic Interventions: 1 to 1 sessions, Unit Group sessions and Medication administration.  Evaluation of Outcomes: Progressing   RN Treatment Plan for Primary Diagnosis: Amphetamine and psychostimulant-induced psychosis with delusions (HCC) Long Term Goal(s):  Knowledge of disease and therapeutic regimen to maintain health will improve  Short Term Goals: Ability to verbalize frustration and anger appropriately will improve, Ability to demonstrate self-control, Ability to participate in decision making will improve, Ability to verbalize feelings will improve, Ability to disclose and discuss suicidal ideas, Ability to identify and develop effective coping behaviors will improve, and Compliance with prescribed medications will improve  Medication Management: RN will administer medications as ordered by provider, will assess and evaluate patient's response and provide education to patient for prescribed medication. RN will report any adverse and/or side effects to prescribing provider.  Therapeutic Interventions: 1 on 1 counseling sessions, Psychoeducation, Medication administration, Evaluate responses to treatment, Monitor vital signs and CBGs as ordered, Perform/monitor CIWA, COWS, AIMS and Fall Risk screenings as ordered, Perform wound care treatments as ordered.  Evaluation of Outcomes: Progressing   LCSW Treatment Plan for Primary Diagnosis: Amphetamine and psychostimulant-induced psychosis with delusions (HCC) Long Term Goal(s): Safe transition to appropriate next level of care at discharge, Engage patient in therapeutic group addressing interpersonal concerns.  Short Term Goals: Engage patient in aftercare planning with referrals and resources, Increase social support, Increase ability to appropriately verbalize feelings, Increase emotional regulation, Facilitate acceptance of mental health diagnosis and concerns, Facilitate patient progression through stages of change regarding substance use diagnoses and concerns, Identify triggers associated with mental health/substance abuse issues, and Increase skills for wellness and recovery  Therapeutic Interventions: Assess for all discharge needs, 1 to 1 time with Social worker, Explore available resources and  support systems, Assess for adequacy in community support network, Educate family and significant other(s) on suicide prevention, Complete Psychosocial Assessment, Interpersonal group therapy.  Evaluation of Outcomes: Progressing   Progress in Treatment: Attending groups: No. Participating in groups: No. Taking medication as prescribed: Yes. Toleration medication: Yes. Family/Significant other contact made: Yes, individual(s) contacted:  SPE completed with the patient's daughter.  Patient understands diagnosis: Yes. Discussing patient identified problems/goals with staff: Yes. Medical problems stabilized or resolved: Yes. Denies suicidal/homicidal ideation: Yes. Issues/concerns per patient self-inventory: No. Other: none  New problem(s) identified: No, Describe:  none  New Short Term/Long Term Goal(s): detox, elimination of symptoms of psychosis, medication management for mood stabilization; elimination of SI thoughts; development of comprehensive mental wellness/sobriety plan.   Patient Goals:  "I have a lot of depression and mental health that I need to deal with"  Discharge Plan or Barriers: Patient remains unclear on discharge plans. Patient reports that she would like to attend [facility] with [facility] and return to the home of her mother. Mother is declining to have the patient return unless patient attends "long-term rehab". Patient has been unclear if she wants to attend a long term facility.   Reason for Continuation of Hospitalization: Anxiety Depression Medication stabilization Suicidal ideation Withdrawal symptoms  Estimated Length of Stay:  1-7 days  Last 3 [place] Suicide Severity Risk Score: Flowsheet Row Admission (Current) from [facility]/28/2021 in [facility]ICINE ED from [facility]/27/2021 [facility] from 09/28/2020 in [facility]  C-SSRS RISK CATEGORY No Risk No Risk No Risk        Last PHQ 2/9 Scores:     No data to display  Scribe for Treatment Team: Harden Mo, [place] 08/30/2021 9:53 AM

## 2021-08-30 NOTE — Group Note (Signed)
[facility] LCSW Group Therapy Note   Group Date: 08/30/2021 Start Time: 1300 End Time: 1400   Type of Therapy/Topic:  Group Therapy:  Balance in Life  Participation Level:  Active   Description of Group:    This group will address the concept of balance and how it feels and looks when one is unbalanced. Patients will be encouraged to process areas in their lives that are out of balance, and identify reasons for remaining unbalanced. Facilitators will guide patients utilizing problem- solving interventions to address and correct the stressor making their life unbalanced. Understanding and applying boundaries will be explored and addressed for obtaining  and maintaining a balanced life. Patients will be encouraged to explore ways to assertively make their unbalanced needs known to significant others in their lives, using other group members and facilitator for support and feedback.  Therapeutic Goals: Patient will identify two or more emotions or situations they have that consume much of in their lives. Patient will identify signs/triggers that life has become out of balance:  Patient will identify two ways to set boundaries in order to achieve balance in their lives:  Patient will demonstrate ability to communicate their needs through discussion and/or role plays  Summary of Patient Progress: Patient was present in group.  Patient was an active participant. Patient was able to engage in discussion on what balance means to her.  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  Therapeutic Modalities:   Cognitive Behavioral Therapy Solution-Focused Therapy Assertiveness Training   Harden Mo, LCSW

## 2021-08-30 NOTE — Discharge Summary (Signed)
Physician Discharge Summary Note  Patient:  Tamara Nguyen is an 41 y.o., female MRN:  [id number] DOB:  12-08-1980 Patient phone:  [phone] (home)  Patient address:   [address] [place] [address],  Total Time spent with patient: 1 hour  Date of Admission:  08/28/2021 Date of Discharge: 08/31/2021  Reason for Admission: Patient was admitted after presenting to the emergency room with psychotic symptoms and relapsed on to amphetamines with mood lability and dysphoria  Principal Problem: Amphetamine and psychostimulant-induced psychosis with delusions [facility]) Discharge Diagnoses: Principal Problem:   Amphetamine and psychostimulant-induced psychosis with delusions (HCC) Active Problems:   Episode of moderate major depression (HCC)   Past Psychiatric History: Past history of treatment of depression as well as longstanding chronic substance abuse  Past Medical History:  Past Medical History:  Diagnosis Date   Major depressive disorder     Past Surgical History:  Procedure Laterality Date   PANCREAS SURGERY     Family History: History reviewed. No pertinent family history. Family Psychiatric  History: See previous Social History:  Social History   Substance and Sexual Activity  Alcohol Use No     Social History   Substance and Sexual Activity  Drug Use Yes   Types: Methamphetamines    Social History   Socioeconomic History   Marital status: Single    Spouse name: Not on file   Number of children: Not on file   Years of education: Not on file   Highest education level: Not on file  Occupational History   Not on file  Tobacco Use   Smoking status: Every Day    Packs/day: 1.00    Types: Cigarettes   Smokeless tobacco: Never  Vaping Use   Vaping Use: Never used  Substance and Sexual Activity   Alcohol use: No   Drug use: Yes    Types: Methamphetamines   Sexual activity: Not Currently  Other Topics Concern   Not on file  Social History  Narrative   Not on file   Social Determinants of Health   Financial Resource Strain: Not on file  Food Insecurity: Not on file  Transportation Needs: Not on file  Physical Activity: Not on file  Stress: Not on file  Social Connections: Not on file    Hospital Course: Admitted to the psychiatric unit.  15-minute checks continued.  Patient did not have any major behavior problems on the unit.  When she was continued on medication for her mood.  She was engaged in individual and group therapy and attended treatment team.  Patient is now calm and lucid with no current psychotic symptoms.  Completely denies suicidal ideation.  She was offered the option of rehab but prefers to follow up with the IOP program at [facility].  She has now made an agreement with her mother that the mother will pick her up tomorrow with follow-up at [facility].  Supply of medicine and prescriptions provided  Physical Findings: AIMS: Facial and Oral Movements Muscles of Facial Expression: None, normal Lips and Perioral Area: None, normal Jaw: None, normal Tongue: None, normal,Extremity Movements Upper (arms, wrists, hands, fingers): None, normal Lower (legs, knees, ankles, toes): None, normal, Trunk Movements Neck, shoulders, hips: None, normal, Overall Severity Severity of abnormal movements (highest score from questions above): None, normal Incapacitation due to abnormal movements: None, normal Patient's awareness of abnormal movements (rate only patient's report): No Awareness, Dental Status Current problems with teeth and/or dentures?: No Does patient usually wear dentures?:  No  CIWA:    COWS:     Musculoskeletal: Strength & Muscle Tone: within normal limits Gait & Station: normal Patient leans: N/A   Psychiatric Specialty Exam:  Presentation  General Appearance: Appropriate for Environment  Eye Contact:Fleeting  Speech:Clear and Coherent  Speech Volume:Normal  Handedness:Right   Mood and Affect   Mood:Anxious  Affect:Congruent   Thought Process  Thought Processes:Coherent  Descriptions of Associations:Intact  Orientation:Full (Time, Place and Person)  Thought Content:Scattered  History of Schizophrenia/Schizoaffective disorder:No  Duration of Psychotic Symptoms:No data recorded Hallucinations:No data recorded Ideas of Reference:None  Suicidal Thoughts:No data recorded Homicidal Thoughts:No data recorded  Sensorium  Memory:Immediate Good  Judgment:Poor  Insight:Poor   Executive Functions  Concentration:Fair  Attention Span:Fair  Recall:Fair  Fund of Knowledge:Fair  Language:Fair   Psychomotor Activity  Psychomotor Activity:No data recorded  Assets  Assets:Financial Resources/Insurance; Social Support; Resilience   Sleep  Sleep:No data recorded   Physical Exam: Physical Exam Vitals and nursing note reviewed.  Constitutional:      Appearance: Normal appearance.  HENT:     Head: Normocephalic and atraumatic.     Mouth/Throat:     Pharynx: Oropharynx is clear.  Eyes:     Pupils: Pupils are equal, round, and reactive to light.  Cardiovascular:     Rate and Rhythm: Normal rate and regular rhythm.  Pulmonary:     Effort: Pulmonary effort is normal.     Breath sounds: Normal breath sounds.  Abdominal:     General: Abdomen is flat.     Palpations: Abdomen is soft.  Musculoskeletal:        General: Normal range of motion.  Skin:    General: Skin is warm and dry.  Neurological:     General: No focal deficit present.     Mental Status: She is alert. Mental status is at baseline.  Psychiatric:        Attention and Perception: Attention normal.        Mood and Affect: Mood normal.        Speech: Speech normal.        Behavior: Behavior is cooperative.        Thought Content: Thought content normal.        Cognition and Memory: Cognition normal.        Judgment: Judgment is impulsive.    Review of Systems  Constitutional: Negative.    HENT: Negative.    Eyes: Negative.   Respiratory: Negative.    Cardiovascular: Negative.   Gastrointestinal: Negative.   Musculoskeletal: Negative.   Skin: Negative.   Neurological: Negative.   Psychiatric/Behavioral: Negative.     Blood pressure 111/71, pulse 74, temperature 98.3 F (36.8 C), temperature source Oral, resp. rate 18, height 5\' 1"  (1.549 m), weight 71.7 kg, SpO2 98 %. Body mass index is 29.85 kg/m.   Social History   Tobacco Use  Smoking Status Every Day   Packs/day: 1.00   Types: Cigarettes  Smokeless Tobacco Never   Tobacco Cessation:  A prescription for an FDA-approved tobacco cessation medication was offered at discharge and the patient refused   Blood Alcohol level:  Lab Results  Component Value Date   [facility] <10 08/27/2021   ETH <10 09/28/2020    Metabolic Disorder Labs:  Lab Results  Component Value Date   HGBA1C 5.4 01/03/2020   MPG 108 01/03/2020   No results found for: "PROLACTIN" Lab Results  Component Value Date   CHOL 200 01/03/2020   TRIG 152 (H)  01/03/2020   HDL 46 01/03/2020   CHOLHDL 4.3 01/03/2020   VLDL 30 01/03/2020   LDLCALC 124 (H) 01/03/2020    See Psychiatric Specialty Exam and Suicide Risk Assessment completed by Attending Physician prior to discharge.  Discharge destination:  Home  Is patient on multiple antipsychotic therapies at discharge:  No   Has Patient had three or more failed trials of antipsychotic monotherapy by history:  No  Recommended Plan for Multiple Antipsychotic Therapies: NA     Follow-up Information     [facility] Follow up.   Why: Your appointment is scheduled for Wednesday, 09/04/21. You will meet Lorella Nimrod, peer support specialist, there at 7:30AM. Thanks! Contact information: [address] [place] [address] [phone]                 Follow-up recommendations: Follow-up at [facility].  Continue current medicine  Comments: Prescriptions  provided  Signed: Mordecai Rasmussen, MD 08/30/2021, 2:45 PM

## 2021-08-30 NOTE — Progress Notes (Signed)
DAR NOTE: Patient presents with anxious affect and depressed mood.  Denies suicidal thoughts, pain, auditory and visual hallucinations.  Described energy level as low with poor concentration.  Rates depression at 10, hopelessness at 10, and anxiety at 10.  Maintained on routine safety checks.  Medications given as prescribed.  Support and encouragement offered as needed.  Attended group and participated.  States goal for today is "talking with Dr about treatment."  Patient visible in the dayroom with minimal interaction.  Requested and received Vistaril 25 mg for complain of anxiety with good effect.  Patient is safe on the unit.

## 2021-08-30 NOTE — Progress Notes (Signed)
Patient calm and pleasant during assessment denying SI/HI/AVH. Pt endorses anxiety and depression. Pt isolative to her room tonight but did come out and get snack. Pt didn't have any night medications and hasn't requested anything PRN. Pt given education, support, and encouragement to be active in her treatment plan. Pt being monitored Q 15 minutes for safety per unit protocol. Pt remains safe on the unit.

## 2021-08-31 NOTE — Plan of Care (Signed)
Patient is out of bed and pleasant on approach. Alert and oriented x 4. Denies SI/HI/AVH. Expressing some anxiety related to discharge but still expresses readiness for discharge.

## 2021-08-31 NOTE — Progress Notes (Signed)
Patient discharged with medications and prescriptions. Verbalized understanding of discharge instructions. Denied SI/HI.

## 2021-08-31 NOTE — Progress Notes (Signed)
  [facility] Adult Case Management Discharge Plan :  Will you be returning to the same living situation after discharge:  Yes,  Return to place of residence with mother.  At discharge, do you have transportation home?: Yes,  Patient's mother to assist with transportation from hospital.  Do you have the ability to pay for your medications: Yes,  [place] [employer]  Release of information consent forms completed and in the chart;  Patient's signature needed at discharge.  Patient to Follow up at:  Follow-up Information     [facility] Follow up.   Why: Your appointment is scheduled for Wednesday, 09/04/21. You will meet Lorella Nimrod, peer support specialist, there at 7:30AM. Thanks! Contact information: [address] [place] [address] [phone]                 Next level of care provider has access to [facility] Link:no  Safety Planning and Suicide Prevention discussed: Yes, SPE completed with patient and Ranelle Auker, daughter.    Has patient been referred to the Quitline?: Yes, faxed on 08/31/2021  Patient has been referred for addiction treatment: Yes Patient reports recent relapse, referred for outpatient chemical dependency intensive outpatient services; appointment listed above.   Social History   Substance and Sexual Activity  Alcohol Use No  Alcohol Level    Component Value Date/Time   [facility] <10 08/27/2021 1025   Social History   Substance and Sexual Activity  Drug Use Yes   Types: Methamphetamines    Corky Crafts, LCSWA 08/31/2021, 8:25 AM

## 2021-09-16 ENCOUNTER — Ambulatory Visit: Admit: 2021-09-16 | Discharge: 2021-09-17 | Payer: MEDICAID

## 2021-09-16 DIAGNOSIS — Z124 Encounter for screening for malignant neoplasm of cervix: Principal | ICD-10-CM

## 2021-09-16 DIAGNOSIS — Z113 Encounter for screening for infections with a predominantly sexual mode of transmission: Principal | ICD-10-CM

## 2021-09-16 DIAGNOSIS — F419 Anxiety disorder, unspecified: Principal | ICD-10-CM

## 2021-09-16 DIAGNOSIS — F1520 Other stimulant dependence, uncomplicated: Principal | ICD-10-CM

## 2021-09-16 MED ORDER — HYDROXYZINE HCL 10 MG TABLET
ORAL_TABLET | Freq: Three times a day (TID) | ORAL | 0 refills | 30 days | Status: CP | PRN
Start: 2021-09-16 — End: ?

## 2021-09-16 MED ORDER — FLUOXETINE 10 MG CAPSULE
ORAL_CAPSULE | Freq: Every day | ORAL | 0 refills | 30 days | Status: CP
Start: 2021-09-16 — End: 2022-09-11

## 2021-11-29 ENCOUNTER — Ambulatory Visit: Admit: 2021-11-29 | Discharge: 2021-11-30 | Payer: MEDICAID

## 2021-11-29 DIAGNOSIS — F1521 Other stimulant dependence, in remission: Principal | ICD-10-CM

## 2021-11-29 DIAGNOSIS — E559 Vitamin D deficiency, unspecified: Principal | ICD-10-CM

## 2021-11-29 DIAGNOSIS — F3181 Bipolar II disorder: Principal | ICD-10-CM

## 2021-11-29 DIAGNOSIS — K8689 Other specified diseases of pancreas: Principal | ICD-10-CM

## 2021-11-29 DIAGNOSIS — R1032 Left lower quadrant pain: Principal | ICD-10-CM

## 2021-11-29 DIAGNOSIS — K5900 Constipation, unspecified: Principal | ICD-10-CM

## 2021-12-04 DIAGNOSIS — E559 Vitamin D deficiency, unspecified: Principal | ICD-10-CM

## 2021-12-04 MED ORDER — CHOLECALCIFEROL (VITAMIN D3) 25 MCG (1,000 UNIT) TABLET
ORAL_TABLET | Freq: Every day | ORAL | 4 refills | 30 days | Status: CP
Start: 2021-12-04 — End: 2022-05-03

## 2021-12-30 DIAGNOSIS — K8689 Other specified diseases of pancreas: Principal | ICD-10-CM

## 2021-12-30 DIAGNOSIS — K860 Alcohol-induced chronic pancreatitis: Principal | ICD-10-CM

## 2021-12-30 MED ORDER — LIPASE 3,000-PROTEASE 9,500-AMYLASE 15,000 UNIT CAPSULE, DELAYED REL
ORAL_CAPSULE | Freq: Three times a day (TID) | ORAL | 3 refills | 90 days | Status: CP
Start: 2021-12-30 — End: 2022-12-30

## 2022-01-06 DIAGNOSIS — K860 Alcohol-induced chronic pancreatitis: Principal | ICD-10-CM

## 2022-01-06 DIAGNOSIS — K8689 Other specified diseases of pancreas: Principal | ICD-10-CM

## 2022-01-06 MED ORDER — LIPASE 3,000-PROTEASE 9,500-AMYLASE 15,000 UNIT CAPSULE, DELAYED REL
ORAL_CAPSULE | Freq: Three times a day (TID) | ORAL | 3 refills | 90 days
Start: 2022-01-06 — End: 2023-01-06

## 2022-01-08 MED ORDER — LIPASE 3,000-PROTEASE 9,500-AMYLASE 15,000 UNIT CAPSULE, DELAYED REL
ORAL_CAPSULE | Freq: Three times a day (TID) | ORAL | 3 refills | 90 days
Start: 2022-01-08 — End: 2023-01-08

## 2022-01-10 ENCOUNTER — Ambulatory Visit: Admit: 2022-01-10 | Discharge: 2022-01-11 | Payer: MEDICAID

## 2022-01-10 DIAGNOSIS — K8689 Other specified diseases of pancreas: Principal | ICD-10-CM

## 2022-01-10 DIAGNOSIS — K860 Alcohol-induced chronic pancreatitis: Principal | ICD-10-CM

## 2022-01-10 DIAGNOSIS — F419 Anxiety disorder, unspecified: Principal | ICD-10-CM

## 2022-01-10 MED ORDER — CREON 6,000-19,000-30,000 UNIT CAPSULE,DELAYED RELEASE
ORAL_CAPSULE | Freq: Three times a day (TID) | ORAL | 11 refills | 30 days | Status: CP
Start: 2022-01-10 — End: 2023-01-10

## 2022-03-05 ENCOUNTER — Ambulatory Visit: Admit: 2022-03-05 | Discharge: 2022-03-05 | Payer: MEDICAID

## 2022-03-05 DIAGNOSIS — E559 Vitamin D deficiency, unspecified: Principal | ICD-10-CM

## 2022-03-05 DIAGNOSIS — E162 Hypoglycemia, unspecified: Principal | ICD-10-CM

## 2022-03-05 DIAGNOSIS — K8689 Other specified diseases of pancreas: Principal | ICD-10-CM

## 2022-03-05 MED ORDER — MEDICAL SUPPLY ITEM
0 refills | 0 days | Status: CP
Start: 2022-03-05 — End: ?

## 2022-03-05 MED ORDER — BLOOD-GLUCOSE METER KIT WRAPPER
0 refills | 0 days | Status: CP
Start: 2022-03-05 — End: 2023-03-05

## 2022-03-05 MED ORDER — LIPASE-PROTEASE-AMYLASE 12,000-38,000-60,000 UNIT CAPSULE,DELAYED REL
ORAL_CAPSULE | Freq: Three times a day (TID) | ORAL | 11 refills | 30 days | Status: CP
Start: 2022-03-05 — End: 2023-03-05

## 2022-03-05 MED ORDER — ACCU-CHEK GUIDE TEST STRIPS
ORAL_STRIP | SUBCUTANEOUS | 3 refills | 0.00000 days | Status: CP
Start: 2022-03-05 — End: ?

## 2022-03-05 MED ORDER — BLOOD GLUCOSE TEST STRIPS
ORAL_STRIP | Freq: Two times a day (BID) | 3 refills | 0 days | Status: CP
Start: 2022-03-05 — End: 2022-03-05

## 2022-03-06 MED ORDER — ACCU-CHEK GUIDE TEST STRIPS
3 refills | 0 days
Start: 2022-03-06 — End: ?

## 2022-04-18 ENCOUNTER — Ambulatory Visit: Admit: 2022-04-18 | Discharge: 2022-04-19 | Payer: MEDICAID

## 2022-04-18 DIAGNOSIS — E162 Hypoglycemia, unspecified: Principal | ICD-10-CM

## 2022-04-18 DIAGNOSIS — K8689 Other specified diseases of pancreas: Principal | ICD-10-CM

## 2022-04-18 DIAGNOSIS — E559 Vitamin D deficiency, unspecified: Principal | ICD-10-CM

## 2022-04-18 DIAGNOSIS — Q8901 Asplenia (congenital): Principal | ICD-10-CM

## 2022-08-22 ENCOUNTER — Ambulatory Visit: Admit: 2022-08-22 | Payer: MEDICAID

## 2022-10-17 ENCOUNTER — Ambulatory Visit: Admit: 2022-10-17 | Payer: BLUE CROSS/BLUE SHIELD

## 2023-04-10 ENCOUNTER — Ambulatory Visit: Admit: 2023-04-10 | Payer: PRIVATE HEALTH INSURANCE

## 2023-06-16 DIAGNOSIS — Z7189 Other specified counseling: Principal | ICD-10-CM

## 2023-06-25 ENCOUNTER — Ambulatory Visit: Admit: 2023-06-25
